# Patient Record
Sex: Female | Born: 1940
Health system: Southern US, Community
[De-identification: ages and names within clinical notes are randomized; demographics above are authoritative.]

## PROBLEM LIST (undated history)

## (undated) DIAGNOSIS — I10 Essential (primary) hypertension: Secondary | ICD-10-CM

## (undated) DIAGNOSIS — F32A Depression, unspecified: Secondary | ICD-10-CM

## (undated) DIAGNOSIS — E7800 Pure hypercholesterolemia, unspecified: Secondary | ICD-10-CM

## (undated) DIAGNOSIS — F329 Major depressive disorder, single episode, unspecified: Secondary | ICD-10-CM

## (undated) DIAGNOSIS — K219 Gastro-esophageal reflux disease without esophagitis: Secondary | ICD-10-CM

## (undated) HISTORY — PX: ABDOMINAL HYSTERECTOMY: SHX81

## (undated) HISTORY — PX: KIDNEY SURGERY: SHX687

## (undated) HISTORY — PX: HIP SURGERY: SHX245

---

## 2018-02-17 ENCOUNTER — Emergency Department (HOSPITAL_BASED_OUTPATIENT_CLINIC_OR_DEPARTMENT_OTHER): Payer: Medicare Other

## 2018-02-17 ENCOUNTER — Other Ambulatory Visit: Payer: Self-pay

## 2018-02-17 ENCOUNTER — Emergency Department (HOSPITAL_BASED_OUTPATIENT_CLINIC_OR_DEPARTMENT_OTHER)
Admission: EM | Admit: 2018-02-17 | Discharge: 2018-02-17 | Disposition: A | Discharge status: Home / Self Care | Payer: Medicare Other | Attending: Emergency Medicine | Admitting: Emergency Medicine

## 2018-02-17 ENCOUNTER — Encounter (HOSPITAL_BASED_OUTPATIENT_CLINIC_OR_DEPARTMENT_OTHER): Payer: Self-pay | Admitting: *Deleted

## 2018-02-17 DIAGNOSIS — Z79899 Other long term (current) drug therapy: Secondary | ICD-10-CM | POA: Insufficient documentation

## 2018-02-17 DIAGNOSIS — I10 Essential (primary) hypertension: Secondary | ICD-10-CM | POA: Diagnosis not present

## 2018-02-17 DIAGNOSIS — S6992XA Unspecified injury of left wrist, hand and finger(s), initial encounter: Secondary | ICD-10-CM | POA: Diagnosis present

## 2018-02-17 DIAGNOSIS — F329 Major depressive disorder, single episode, unspecified: Secondary | ICD-10-CM | POA: Insufficient documentation

## 2018-02-17 DIAGNOSIS — Y9289 Other specified places as the place of occurrence of the external cause: Secondary | ICD-10-CM | POA: Insufficient documentation

## 2018-02-17 DIAGNOSIS — Y998 Other external cause status: Secondary | ICD-10-CM | POA: Insufficient documentation

## 2018-02-17 DIAGNOSIS — S62002A Unspecified fracture of navicular [scaphoid] bone of left wrist, initial encounter for closed fracture: Secondary | ICD-10-CM | POA: Diagnosis not present

## 2018-02-17 DIAGNOSIS — W19XXXA Unspecified fall, initial encounter: Secondary | ICD-10-CM

## 2018-02-17 DIAGNOSIS — Y9389 Activity, other specified: Secondary | ICD-10-CM | POA: Diagnosis not present

## 2018-02-17 DIAGNOSIS — W010XXA Fall on same level from slipping, tripping and stumbling without subsequent striking against object, initial encounter: Secondary | ICD-10-CM | POA: Insufficient documentation

## 2018-02-17 HISTORY — DX: Essential (primary) hypertension: I10

## 2018-02-17 HISTORY — DX: Pure hypercholesterolemia, unspecified: E78.00

## 2018-02-17 HISTORY — DX: Major depressive disorder, single episode, unspecified: F32.9

## 2018-02-17 HISTORY — DX: Depression, unspecified: F32.A

## 2018-02-17 HISTORY — DX: Gastro-esophageal reflux disease without esophagitis: K21.9

## 2018-02-17 MED ORDER — ACETAMINOPHEN 500 MG PO TABS
1000.0000 mg | ORAL_TABLET | Freq: Once | ORAL | Status: AC
  Administered 2018-02-17: 1000 mg via ORAL
  Filled 2018-02-17: qty 2

## 2018-02-17 NOTE — Progress Notes (Signed)
77 yo female with displaced left scaphoid waist fracture. Will be placed into a TS splint in ED with F/u in office to have discussion regarding the options.

## 2018-02-17 NOTE — ED Provider Notes (Signed)
[facility] EMERGENCY DEPARTMENT Provider Note   CSN: [id number] Arrival date & time: 02/17/18  1227     History   Chief Complaint Chief Complaint  Patient presents with  . Fall    HPI Margaret Cross is a 77 y.o. female.  Margaret Cross is a 77 y.o. Female 3 of hypertension, hyperlipidemia, GERD and depression, who presents to the ED for evaluation after mechanical fall complaining of pain to her left wrist.  Patient reports she slipped on wet bathroom floor this morning caught herself with her left hand and since then has had pain and swelling over the radial aspect of the left wrist.  It is constant and throbbing, numbness or tingling.  Patient reports she also went down a bit on her right buttock but denies pain, swelling or ecchymosis there and has been ambulatory without pain or any difficulties.  No back or neck pain.  Patient did not hit her head no loss of consciousness.  Denies any other injuries from the fall.  No lacerations or abrasions.  Patient is right-hand dominant.     Past Medical History:  Diagnosis Date  . Depression   . GERD (gastroesophageal reflux disease)   . High cholesterol   . Hypertension     There are no active problems to display for this patient.   Past Surgical History:  Procedure Laterality Date  . ABDOMINAL HYSTERECTOMY       OB History   None      Home Medications    Prior to Admission medications   Medication Sig Start Date End Date Taking? Authorizing Provider  buPROPion (WELLBUTRIN XL) 150 MG 24 hr tablet Take by mouth. 12/13/17  Yes [provider]  citalopram (CELEXA) 40 MG tablet  02/15/18   [provider]  DEXILANT 60 MG capsule  02/15/18   [provider]  RESTASIS 0.05 % ophthalmic emulsion  02/14/18   [provider]  rosuvastatin (CRESTOR) 5 MG tablet TK 1 T PO ON MWF 12/14/17   [provider]    Family History No family history on file.  Social History Social  History   Tobacco Use  . Smoking status: Never Smoker  . Smokeless tobacco: Never Used  Substance Use Topics  . Alcohol use: Not Currently  . Drug use: Never     Allergies   Sulfa antibiotics   Review of Systems Review of Systems  Constitutional: Negative for chills and fever.  Eyes: Negative for visual disturbance.  Cardiovascular: Negative for chest pain.  Gastrointestinal: Negative for abdominal pain.  Musculoskeletal: Positive for arthralgias and joint swelling. Negative for back pain and neck pain.  Skin: Negative for color change, rash and wound.  Neurological: Negative for dizziness, weakness, light-headedness, numbness and headaches.     Physical Exam Updated Vital Signs BP (!) 168/104   Pulse 85   Temp 98.7 F (37.1 C) (Oral)   Resp 20   Ht 5' 7.5" (1.715 m)   Wt 88.5 kg (195 lb)   SpO2 98%   BMI 30.09 kg/m   Physical Exam  Constitutional: She appears well-developed and well-nourished. No distress.  HENT:  Head: Normocephalic and atraumatic.  No evidence of head trauma, no hematoma, step-off or scalp deformity  Eyes: Right eye exhibits no discharge. Left eye exhibits no discharge.  Neck: Neck supple.  Spine nontender to palpation at midline or paraspinally  Pulmonary/Chest: Effort normal. No respiratory distress.  Musculoskeletal:  Tenderness and swelling over the radial aspect of  the left wrist, tenderness at the snuffbox, range of motion of the thumb limited by pain but patient able to wiggle all fingers, pain with wrist range of motion, 2+ radial and ulnar pulse with good capillary refill, sensation intact throughout the hand, median, ulnar and radial nerve intact, no pain or deformity at the elbow or shoulder No midline spinal tenderness No pain or tenderness at the hips with palpation or range of motion  All other joints supple and easily movable, all compartments soft  Neurological: She is alert. Coordination normal.  Skin: Skin is warm and dry.  Capillary refill takes less than 2 seconds. She is not diaphoretic.  Psychiatric: She has a normal mood and affect. Her behavior is normal.  Nursing note and vitals reviewed.    ED Treatments / Results  Labs (all labs ordered are listed, but only abnormal results are displayed) Labs Reviewed - No data to display  EKG None  Radiology Dg Wrist Complete Left  Result Date: 02/17/2018 CLINICAL DATA:  Slipped and fell today landing on her LEFT hand, pain throughout carpal bones EXAM: LEFT WRIST - COMPLETE 3+ VIEW COMPARISON:  None FINDINGS: Diffuse osseous demineralization. Degenerative changes at first [facility]. Transverse minimally distracted fracture at the scaphoid waist. No additional fracture, dislocation, or bone destruction. IMPRESSION: LEFT scaphoid waist fracture. Electronically Signed   By: Ulyses SouthwardMark  Boles M.D.   On: 02/17/2018 13:07    Procedures Procedures (including critical care time)  Medications Ordered in ED Medications  acetaminophen (TYLENOL) tablet 1,000 mg (1,000 mg Oral Given 02/17/18 1451)     Initial Impression / Assessment and Plan / ED Course  I have reviewed the triage vital signs and the nursing notes.  Pertinent labs & imaging results that were available during my care of the patient were reviewed by me and considered in my medical decision making (see chart for details).  Patient presents for evaluation of left wrist pain after mechanical fall, did not hit her head, no loss of consciousness, no midline spinal tenderness, no chest pain, shortness of breath or abdominal pain.  No hip pain.  On exam patient is intensive did not take her blood pressure medications today, was otherwise normal and patient is in no acute distress.  Swelling and tenderness over the radial aspect of the left wrist, this is the patient's nondominant hand.  No other evidence of injury on exam.  Left upper extremity is neurovascularly intact.  X-ray shows left scaphoid fracture.  Placed  patient in a thumb spica splint, Tylenol for pain.  3:15 PM spoke with Dr. Janee Mornhompson with hand surgery, he will have his office call her early next week to arrange follow-up.  At that she is to remain in a thumb spica splint until she is seen by Dr. Janee Mornhompson in the office.  Pain has significantly improved with splinting and Tylenol, encourage patient to use ice and elevation as much as possible and Tylenol as needed for pain.  Strict return precautions discussed.  Patient expresses understanding and is in agreement with plan.  She is stable for discharge at this time and in no acute distress.  Pt's blood pressure was elevated today, pt has hx of HTN, did not take her medications today, pt is not exhibiting any symptoms to suggest hypertensive urgency or emergency today, will have pt follow up with their PCP in 1 week for blood pressure check. Discussed long term consequences of untreated hypertension with the patient.  Discussed with Dr. Rush Landmarkegeler who is in  agreement with plan.   Final Clinical Impressions(s) / ED Diagnoses   Final diagnoses:  Closed displaced fracture of scaphoid of left wrist, unspecified portion of scaphoid, initial encounter  Fall, initial encounter    ED Discharge Orders    None       Dartha Lodge, [place] 02/17/18 1529    Tegeler, Canary Brim, MD 02/17/18 [phone]

## 2018-02-17 NOTE — Discharge Instructions (Signed)
You will need to follow-up with Dr. Janee Mornhompson in his office, his office will call you early next week to schedule an appointment.  Please remain in your splint until then use ice and elevate the hand as much as possible.  Tylenol as needed for pain.  Return to the ED if you have significantly increasing pain, numbness or tingling in her hand or fingers or discoloration of your fingers or any other new or concerning symptoms develop.

## 2018-02-17 NOTE — ED Triage Notes (Signed)
She slipped and fell this am. Injury to her left wrist. Radial pulse palpated.

## 2020-06-02 ENCOUNTER — Emergency Department (HOSPITAL_BASED_OUTPATIENT_CLINIC_OR_DEPARTMENT_OTHER): Payer: Medicare Other

## 2020-06-02 ENCOUNTER — Emergency Department (HOSPITAL_BASED_OUTPATIENT_CLINIC_OR_DEPARTMENT_OTHER)
Admission: EM | Admit: 2020-06-02 | Discharge: 2020-06-02 | Disposition: A | Discharge status: Home / Self Care | Payer: Medicare Other | Source: Home / Self Care | Attending: Emergency Medicine | Admitting: Emergency Medicine

## 2020-06-02 ENCOUNTER — Encounter (HOSPITAL_BASED_OUTPATIENT_CLINIC_OR_DEPARTMENT_OTHER): Payer: Self-pay | Admitting: Emergency Medicine

## 2020-06-02 ENCOUNTER — Other Ambulatory Visit: Payer: Self-pay

## 2020-06-02 DIAGNOSIS — K810 Acute cholecystitis: Secondary | ICD-10-CM | POA: Diagnosis not present

## 2020-06-02 DIAGNOSIS — R52 Pain, unspecified: Secondary | ICD-10-CM | POA: Diagnosis not present

## 2020-06-02 DIAGNOSIS — Z20822 Contact with and (suspected) exposure to covid-19: Secondary | ICD-10-CM | POA: Insufficient documentation

## 2020-06-02 DIAGNOSIS — R05 Cough: Secondary | ICD-10-CM | POA: Insufficient documentation

## 2020-06-02 DIAGNOSIS — R1010 Upper abdominal pain, unspecified: Secondary | ICD-10-CM

## 2020-06-02 DIAGNOSIS — K219 Gastro-esophageal reflux disease without esophagitis: Secondary | ICD-10-CM

## 2020-06-02 DIAGNOSIS — I10 Essential (primary) hypertension: Secondary | ICD-10-CM | POA: Insufficient documentation

## 2020-06-02 DIAGNOSIS — K449 Diaphragmatic hernia without obstruction or gangrene: Secondary | ICD-10-CM

## 2020-06-02 DIAGNOSIS — K440 Diaphragmatic hernia with obstruction, without gangrene: Secondary | ICD-10-CM | POA: Insufficient documentation

## 2020-06-02 DIAGNOSIS — Z79899 Other long term (current) drug therapy: Secondary | ICD-10-CM | POA: Insufficient documentation

## 2020-06-02 LAB — COMPREHENSIVE METABOLIC PANEL
ALT: 234 U/L — ABNORMAL HIGH (ref 0–44)
AST: 76 U/L — ABNORMAL HIGH (ref 15–41)
Albumin: 3.5 g/dL (ref 3.5–5.0)
Alkaline Phosphatase: 152 U/L — ABNORMAL HIGH (ref 38–126)
Anion gap: 12 (ref 5–15)
BUN: 8 mg/dL (ref 8–23)
CO2: 24 mmol/L (ref 22–32)
Calcium: 8.8 mg/dL — ABNORMAL LOW (ref 8.9–10.3)
Chloride: 101 mmol/L (ref 98–111)
Creatinine, Ser: 0.49 mg/dL (ref 0.44–1.00)
GFR calc Af Amer: >60 mL/min (ref >60)
GFR calc non Af Amer: >60 mL/min (ref >60)
Glucose, Bld: 155 mg/dL — ABNORMAL HIGH (ref 70–99)
Potassium: 3.3 mmol/L — ABNORMAL LOW (ref 3.5–5.1)
Sodium: 137 mmol/L (ref 135–145)
Total Bilirubin: 1.6 mg/dL — ABNORMAL HIGH (ref 0.3–1.2)
Total Protein: 7.5 g/dL (ref 6.5–8.1)

## 2020-06-02 LAB — URINALYSIS, ROUTINE W REFLEX MICROSCOPIC
Bilirubin Urine: NEGATIVE
Glucose, UA: NEGATIVE mg/dL
Hgb urine dipstick: NEGATIVE
Ketones, ur: NEGATIVE mg/dL
Leukocytes,Ua: NEGATIVE
Nitrite: NEGATIVE
Protein, ur: NEGATIVE mg/dL
Specific Gravity, Urine: 1.01 (ref 1.005–1.030)
pH: 6 (ref 5.0–8.0)

## 2020-06-02 LAB — CBC WITH DIFFERENTIAL/PLATELET
Abs Immature Granulocytes: 0.04 10*3/uL (ref 0.00–0.07)
Basophils Absolute: 0.1 10*3/uL (ref 0.0–0.1)
Basophils Relative: 1 %
Eosinophils Absolute: 0.1 10*3/uL (ref 0.0–0.5)
Eosinophils Relative: 1 %
HCT: 44 % (ref 36.0–46.0)
Hemoglobin: 14.9 g/dL (ref 12.0–15.0)
Immature Granulocytes: 1 %
Lymphocytes Relative: 9 %
Lymphs Abs: 0.8 10*3/uL (ref 0.7–4.0)
MCH: 32 pg (ref 26.0–34.0)
MCHC: 33.9 g/dL (ref 30.0–36.0)
MCV: 94.6 fL (ref 80.0–100.0)
Monocytes Absolute: 0.7 10*3/uL (ref 0.1–1.0)
Monocytes Relative: 8 %
Neutro Abs: 7.1 10*3/uL (ref 1.7–7.7)
Neutrophils Relative %: 80 %
Platelets: 150 10*3/uL (ref 150–400)
RBC: 4.65 MIL/uL (ref 3.87–5.11)
RDW: 12.3 % (ref 11.5–15.5)
WBC: 8.7 10*3/uL (ref 4.0–10.5)
nRBC: 0 % (ref 0.0–0.2)

## 2020-06-02 LAB — SARS CORONAVIRUS 2 BY RT PCR (HOSPITAL ORDER, PERFORMED IN ~~LOC~~ HOSPITAL LAB): SARS Coronavirus 2: NEGATIVE

## 2020-06-02 LAB — LACTIC ACID, PLASMA
Lactic Acid, Venous: 2.5 mmol/L (ref 0.5–1.9)
Lactic Acid, Venous: 1.7 mmol/L (ref 0.5–1.9)

## 2020-06-02 LAB — TROPONIN I (HIGH SENSITIVITY): Troponin I (High Sensitivity): 9 ng/L (ref <18)

## 2020-06-02 LAB — LIPASE, BLOOD: Lipase: 24 U/L (ref 11–51)

## 2020-06-02 MED ORDER — SODIUM CHLORIDE 0.9 % IV BOLUS
500.0000 mL | Freq: Once | INTRAVENOUS | Status: AC
  Administered 2020-06-02: 500 mL via INTRAVENOUS

## 2020-06-02 MED ORDER — ALUM & MAG HYDROXIDE-SIMETH 200-200-20 MG/5ML PO SUSP
30.0000 mL | Freq: Once | ORAL | Status: AC
  Administered 2020-06-02: 30 mL via ORAL
  Filled 2020-06-02: qty 30

## 2020-06-02 MED ORDER — ONDANSETRON 4 MG PO TBDP
ORAL_TABLET | ORAL | 0 refills | Status: AC
Start: 2020-06-02 — End: ?

## 2020-06-02 MED ORDER — MORPHINE SULFATE (PF) 4 MG/ML IV SOLN
4.0000 mg | Freq: Once | INTRAVENOUS | Status: AC
  Administered 2020-06-02: 4 mg via INTRAVENOUS
  Filled 2020-06-02: qty 1

## 2020-06-02 MED ORDER — IOHEXOL 300 MG/ML  SOLN
100.0000 mL | Freq: Once | INTRAMUSCULAR | Status: AC | PRN
  Administered 2020-06-02: 100 mL via INTRAVENOUS

## 2020-06-02 MED ORDER — ONDANSETRON HCL 4 MG/2ML IJ SOLN
4.0000 mg | Freq: Once | INTRAMUSCULAR | Status: AC
  Administered 2020-06-02: 4 mg via INTRAVENOUS
  Filled 2020-06-02: qty 2

## 2020-06-02 MED ORDER — LIDOCAINE VISCOUS HCL 2 % MT SOLN
15.0000 mL | Freq: Once | OROMUCOSAL | Status: AC
  Administered 2020-06-02: 15 mL via ORAL
  Filled 2020-06-02: qty 15

## 2020-06-02 MED ORDER — POTASSIUM CHLORIDE CRYS ER 20 MEQ PO TBCR
40.0000 meq | EXTENDED_RELEASE_TABLET | Freq: Once | ORAL | Status: AC
  Administered 2020-06-02: 40 meq via ORAL
  Filled 2020-06-02: qty 2

## 2020-06-02 MED ORDER — FAMOTIDINE IN NACL 20-0.9 MG/50ML-% IV SOLN
20.0000 mg | Freq: Once | INTRAVENOUS | Status: AC
  Administered 2020-06-02: 20 mg via INTRAVENOUS
  Filled 2020-06-02: qty 50

## 2020-06-02 MED FILL — ONDANSETRON ODT 4 MG TABLET: 4 | 2 days supply | Qty: 10 | Fill #0

## 2020-06-02 NOTE — Discharge Instructions (Addendum)
Your work-up today has been very reassuring.  Your CT scan shows a large hiatal hernia which can make GERD worse and lead to some pain and inflammation of the lining of your stomach.  Your gallbladder ultrasound looked good today, but you do have some elevation of your liver function tests, we do see signs of some fatty liver disease.  You should follow-up with your PCP and GI doctor about this.  Continue taking your Dexilant daily, at least 30 minutes before your first meal of the day, you can use Maalox as needed for breakthrough symptoms, and use the eating plan provided in your paperwork today for foods to avoid that would worsen your symptoms.  You can use Zofran as needed for nausea and vomiting.  If you develop fever, worsening upper abdominal pain or any other new or concerning symptoms please return for reevaluation.  Please call to schedule follow-up appointment with your PCP and with Dr. Christella Hartigan with GI.

## 2020-06-02 NOTE — ED Triage Notes (Addendum)
Pt having  N/V since last Wednesday.  Decreased appetite, body aches, some fever, some abdominal pain

## 2020-06-02 NOTE — ED Notes (Signed)
US at bedside

## 2020-06-02 NOTE — ED Provider Notes (Signed)
[facility] [facility] EMERGENCY DEPARTMENT Provider Note   CSN: [id number] Arrival date & time: 06/02/20  6629     History Chief Complaint  Patient presents with  . Abdominal Pain    Margaret Cross is a 79 y.o. female.  Margaret Cross is a 79 y.o. female with a history of hypertension, hyperlipidemia, GERD, depression, who presents to the emergency department for evaluation of upper abdominal pain and nausea and vomiting for the past 6 days.  Patient states that when symptoms first started she had a low-grade temperature of 99, no fever since then.  Reports she started having persistent upper abdominal pain, that is worse after eating.  Pain does not radiate. She reports persistent nausea, with 3-4 episodes of nonbloody emesis.  She states she has not vomited today, but feels extremely nauseated and has had poor appetite.  She states that she was constipated for the past 2 days but was able to have a bowel movement this morning and has been passing a lot of gas.  She denies any dysuria or urinary frequency.  Denies any associated chest pain or shortness of breath.  Has a frequent cough related to her acid reflux which is unchanged.  Has had her Covid vaccines.  Reports history of hysterectomy but no other abdominal surgeries.  Has not been taking any medications to treat her symptoms.  No other aggravating or alleviating factors.        Past Medical History:  Diagnosis Date  . Depression   . GERD (gastroesophageal reflux disease)   . High cholesterol   . Hypertension     There are no problems to display for this patient.   Past Surgical History:  Procedure Laterality Date  . ABDOMINAL HYSTERECTOMY       OB History   No obstetric history on file.     No family history on file.  Social History   Tobacco Use  . Smoking status: Never Smoker  . Smokeless tobacco: Never Used  Substance Use Topics  . Alcohol use: Not Currently  . Drug use: Never    Home  Medications Prior to Admission medications   Medication Sig Start Date End Date Taking? Authorizing Provider  buPROPion (WELLBUTRIN XL) 150 MG 24 hr tablet Take by mouth. 12/13/17   [provider]  citalopram (CELEXA) 40 MG tablet  02/15/18   [provider]  DEXILANT 60 MG capsule  02/15/18   [provider]  ondansetron (ZOFRAN ODT) 4 MG disintegrating tablet 26m ODT q4 hours prn nausea/vomit 06/02/20   FJacqlyn Larsen PA-C  RESTASIS 0.05 % ophthalmic emulsion  02/14/18   [provider]  rosuvastatin (CRESTOR) 5 MG tablet TK 1 T PO ON MWF 12/14/17   [provider]    Allergies    Bupropion and Sulfa antibiotics  Review of Systems   Review of Systems  Constitutional: Positive for appetite change and chills. Negative for fever.  HENT: Negative.   Respiratory: Positive for cough (chronic, unchanged). Negative for shortness of breath.   Cardiovascular: Negative for chest pain.  Gastrointestinal: Positive for abdominal pain, nausea and vomiting. Negative for blood in stool, constipation and diarrhea.  Genitourinary: Negative for dysuria, flank pain, frequency and hematuria.  Musculoskeletal: Negative for arthralgias, back pain and myalgias.  Skin: Negative for color change and rash.  Neurological: Negative for dizziness, syncope and light-headedness.  All other systems reviewed and are negative.   Physical Exam Updated Vital Signs BP (!) 160/89   Pulse 97  Temp 98.6 F (37 C) (Oral)   Resp 20   Ht 5' 7"  (1.702 m)   Wt 87.4 kg   SpO2 95%   BMI 30.18 kg/m   Physical Exam Vitals and nursing note reviewed.  Constitutional:      General: She is not in acute distress.    Appearance: Normal appearance. She is well-developed and normal weight. She is not diaphoretic.     Comments: Elderly female, alert, well-appearing and in no acute distress  HENT:     Head: Normocephalic and atraumatic.  Eyes:     General:        Right eye: No  discharge.        Left eye: No discharge.     Pupils: Pupils are equal, round, and reactive to light.  Cardiovascular:     Rate and Rhythm: Normal rate and regular rhythm.     Heart sounds: Normal heart sounds.  Pulmonary:     Effort: Pulmonary effort is normal. No respiratory distress.     Breath sounds: Normal breath sounds. No wheezing or rales.  Abdominal:     General: Bowel sounds are normal. There is no distension.     Palpations: Abdomen is soft. There is no mass.     Tenderness: There is abdominal tenderness in the right upper quadrant, epigastric area and left upper quadrant. There is no guarding.     Comments: Abdomen is soft, nondistended, bowel sounds present throughout, there is tenderness across the upper abdomen, most notably in the right upper quadrant, no guarding or peritoneal signs  Musculoskeletal:        General: No deformity.     Cervical back: Neck supple.  Skin:    General: Skin is warm and dry.     Capillary Refill: Capillary refill takes less than 2 seconds.  Neurological:     General: No focal deficit present.     Mental Status: She is alert.     Coordination: Coordination normal.     Comments: Speech is clear, able to follow commands Moves extremities without ataxia, coordination intact  Psychiatric:        Mood and Affect: Mood normal.        Behavior: Behavior normal.     ED Results / Procedures / Treatments   Labs (all labs ordered are listed, but only abnormal results are displayed) Labs Reviewed  LACTIC ACID, PLASMA - Abnormal; Notable for the following components:      Result Value   Lactic Acid, Venous 2.5 (*)    All other components within normal limits  COMPREHENSIVE METABOLIC PANEL - Abnormal; Notable for the following components:   Potassium 3.3 (*)    Glucose, Bld 155 (*)    Calcium 8.8 (*)    AST 76 (*)    ALT 234 (*)    Alkaline Phosphatase 152 (*)    Total Bilirubin 1.6 (*)    All other components within normal limits  SARS  CORONAVIRUS 2 BY RT PCR (HOSPITAL ORDER, [facility])  CBC WITH DIFFERENTIAL/PLATELET  URINALYSIS, ROUTINE W REFLEX MICROSCOPIC  LACTIC ACID, PLASMA  LIPASE, BLOOD  TROPONIN I (HIGH SENSITIVITY)    EKG EKG Interpretation  Date/Time:  Monday June 02 2020 10:09:52 EDT Ventricular Rate:  84 PR Interval:    QRS Duration: 99 QT Interval:  415 QTC Calculation: 491 R Axis:   18 Text Interpretation: Sinus rhythm Borderline prolonged QT interval No significant change since last tracing Confirmed by  Gareth Morgan [phone]) on 06/02/2020 1:53:29 PM   Radiology DG Chest 2 View  Result Date: 06/02/2020 CLINICAL DATA:  Nausea and vomiting EXAM: CHEST - 2 VIEW COMPARISON:  Same day CT FINDINGS: The cardiomediastinal silhouette is normal in contour with a tortuous thoracic aorta.Atherosclerotic calcifications of the aorta. No pleural effusion. No pneumothorax. RIGHT basilar heterogeneous opacity. Hiatal hernia. Visualized abdomen is unremarkable. Multilevel degenerative changes of the thoracic spine. IMPRESSION: RIGHT basilar heterogeneous opacity. Differential considerations include atelectasis, aspiration or infection. Electronically Signed   By: Valentino Saxon MD   On: 06/02/2020 14:12   CT ABDOMEN PELVIS W CONTRAST  Result Date: 06/02/2020 CLINICAL DATA:  Acute upper abdominal pain. EXAM: CT ABDOMEN AND PELVIS WITH CONTRAST TECHNIQUE: Multidetector CT imaging of the abdomen and pelvis was performed using the standard protocol following bolus administration of intravenous contrast. CONTRAST:  128m OMNIPAQUE IOHEXOL 300 MG/ML  SOLN COMPARISON:  Ultrasound of same day. FINDINGS: Lower chest: Moderate size sliding-type hiatal hernia is noted. Visualized lung bases are unremarkable. Hepatobiliary: No focal liver abnormality is seen. No gallstones or biliary dilatation. Mild inflammatory changes appear to be present around the gallbladder. Pancreas: Unremarkable. No pancreatic  ductal dilatation or surrounding inflammatory changes. Spleen: Calcified splenic granulomata are noted. Possible 9 mm calcified distal splenic artery aneurysm. Adrenals/Urinary Tract: Adrenal glands are unremarkable. Kidneys are normal, without renal calculi, focal lesion, or hydronephrosis. Bladder is unremarkable. Stomach/Bowel: There is no evidence of bowel obstruction or inflammation. The appendix is unremarkable. Moderate size sliding-type hiatal hernia is again noted. Vascular/Lymphatic: Aortic atherosclerosis. No enlarged abdominal or pelvic lymph nodes. Reproductive: Status post hysterectomy. No adnexal masses. Other: No abdominal wall hernia or abnormality. No abdominopelvic ascites. Musculoskeletal: No acute or significant osseous findings. IMPRESSION: 1. Moderate size sliding-type hiatal hernia. 2. Mild inflammatory changes appear to be present around the gallbladder. No gallstones or biliary dilatation is noted. HIDA scan may be performed for further evaluation. 3. Possible 9 mm calcified distal splenic artery aneurysm. Aortic Atherosclerosis (ICD10-I70.0). Electronically Signed   By: JMarijo ConceptionM.D.   On: 06/02/2020 13:29   [place]dom[place] Limited RUQ  Result Date: 06/02/2020 CLINICAL DATA:  79year old female with a history of upper abdominal pain EXAM: ULTRASOUND ABDOMEN LIMITED RIGHT UPPER QUADRANT COMPARISON:  None. FINDINGS: Gallbladder: No gallstones or wall thickening visualized. No sonographic Murphy sign noted by sonographer. Common bile duct: Diameter: 4 mm Liver: Heterogeneous echogenicity of the liver, relatively increased. Focal fatty sparing adjacent to the gallbladder. No focal lesion. Portal vein is patent on color Doppler imaging with normal direction of blood flow towards the liver. Other: None. IMPRESSION: Sonographic survey negative for cholelithiasis or sonographic evidence of acute cholecystitis. Liver steatosis. Electronically Signed   By: JCorrie MckusickD.O.   On: 06/02/2020  10:46    Procedures Procedures (including critical care time)  Medications Ordered in ED Medications  sodium chloride 0.9 % bolus 500 mL (0 mLs Intravenous Stopped 06/02/20 1202)  ondansetron (ZOFRAN) injection 4 mg (4 mg Intravenous Given 06/02/20 1004)  morphine 4 MG/ML injection 4 mg (4 mg Intravenous Given 06/02/20 1007)  famotidine (PEPCID) IVPB 20 mg premix (0 mg Intravenous Stopped 06/02/20 1202)  sodium chloride 0.9 % bolus 500 mL ( Intravenous Stopped 06/02/20 1432)  iohexol (OMNIPAQUE) 300 MG/ML solution 100 mL (100 mLs Intravenous Contrast Given 06/02/20 1307)  potassium chloride SA (KLOR-CON) CR tablet 40 mEq (40 mEq Oral Given 06/02/20 1309)  alum & mag hydroxide-simeth (MAALOX/MYLANTA) 200-200-20 MG/5ML suspension 30 mL (30 mLs Oral  Given 06/02/20 1437)    And  lidocaine (XYLOCAINE) 2 % viscous mouth solution 15 mL (15 mLs Oral Given 06/02/20 1437)    ED Course  I have reviewed the triage vital signs and the nursing notes.  Pertinent labs & imaging results that were available during my care of the patient were reviewed by me and considered in my medical decision making (see chart for details).    MDM Rules/Calculators/A&P                         79 year old female presents with upper abdominal pain, nausea and vomiting for the past 6 days.  Associated with decreased appetite, and body aches.  No documented fevers.  No associated chest pain, shortness of breath or cough.  Patient has had her Covid vaccines.  She reports persistent pain across the upper abdomen is worse after eating, poor appetite and persistent nausea, no blood in emesis or stool.  Patient is overall well-appearing, hypertensive but all other vitals normal.  She does not have any peritoneal signs on exam.  Will get abdominal labs including lactic acid given postprandial plane, some concern for mesenteric ischemia, although pain does not seem out of proportion for exam.  Also concern for potential gallbladder pathology, she  also has a history of GERD.  Will get right upper quadrant ultrasound but if negative patient will need CT.  I have independently ordered, reviewed and interpreted all labs and imaging: CBC: No leukocytosis, normal hemoglobin CMP: Mild hypokalemia of 3.3, glucose of 155, no other significant electrolyte derangements, mild elevation of AST and ALT, alk phos of 152 and T bili of 1.6. Lipase: WNL Lactic acid: Initially 2.5, I suspect this is in the setting of dehydration, given 1 week of pain, would expect to be higher in the setting of mesenteric ischemia, after fluids lactic improved to 1.7 UA: No signs of infection Covid: Negative Troponin: Negative  RUQ [place]: No evidence of acute cholecystitis some fatty liver disease noted  CT abdomen pelvis: Moderate sliding hiatal hernia, some possible inflammation noted around gallbladder, no evidence of biliary dilatation or obstruction on CT, inflammation not noted on ultrasound which is more sensitive view of the gallbladder.  Calcified aneurysm of splenic artery noted.  Medications: Patient given IV fluids, antiemetics, pain medication, Pepcid for potential GERD.  Also given p.o. potassium replacement.  After initial medications patient symptoms are improving, she was given a GI cocktail with continued improvement and has been tolerating p.o. fluids since then.  Patient's troponin is negative.  CT with question of possible right basilar opacity, potential atelectasis versus infection, but bases of the lungs on CT appear clear, patient has no leukocytosis or cough and is afebrile and I doubt infection.  We will have patient continue with PPI, encouraged her to use Maalox as needed for breakthrough symptoms, Zofran prescribed for nausea and vomiting, encouraged to have small frequent meals hand plenty of fluids and follow closely with PCP and GI.  Patient and daughter expressed understanding and agreement with plan.  Discharged home in good  condition.  Patient discussed with Dr. Billy Fischer, who saw patient as well and agrees with plan, recommends checking troponin and chest x-ray.  Final Clinical Impression(s) / ED Diagnoses Final diagnoses:  Upper abdominal pain  Hiatal hernia with GERD    Rx / DC Orders ED Discharge Orders         Ordered    ondansetron (ZOFRAN ODT) 4 MG disintegrating tablet  Discontinue  Reprint     06/02/20 1523           Jacqlyn Larsen, PA-C 06/02/20 1623    Gareth Morgan, MD 06/03/20 2256

## 2020-06-05 ENCOUNTER — Emergency Department (HOSPITAL_BASED_OUTPATIENT_CLINIC_OR_DEPARTMENT_OTHER): Payer: Medicare Other

## 2020-06-05 ENCOUNTER — Inpatient Hospital Stay (HOSPITAL_BASED_OUTPATIENT_CLINIC_OR_DEPARTMENT_OTHER)
Admission: EM | Admit: 2020-06-05 | Discharge: 2020-06-12 | DRG: 417 | Disposition: A | Discharge status: Home / Self Care | Payer: Medicare Other | Attending: Internal Medicine | Admitting: Internal Medicine

## 2020-06-05 ENCOUNTER — Encounter (HOSPITAL_BASED_OUTPATIENT_CLINIC_OR_DEPARTMENT_OTHER): Payer: Self-pay | Admitting: Emergency Medicine

## 2020-06-05 ENCOUNTER — Other Ambulatory Visit: Payer: Self-pay

## 2020-06-05 DIAGNOSIS — K219 Gastro-esophageal reflux disease without esophagitis: Secondary | ICD-10-CM | POA: Diagnosis present

## 2020-06-05 DIAGNOSIS — I10 Essential (primary) hypertension: Secondary | ICD-10-CM | POA: Diagnosis present

## 2020-06-05 DIAGNOSIS — Z791 Long term (current) use of non-steroidal anti-inflammatories (NSAID): Secondary | ICD-10-CM

## 2020-06-05 DIAGNOSIS — R7401 Elevation of levels of liver transaminase levels: Secondary | ICD-10-CM | POA: Diagnosis present

## 2020-06-05 DIAGNOSIS — F329 Major depressive disorder, single episode, unspecified: Secondary | ICD-10-CM | POA: Diagnosis present

## 2020-06-05 DIAGNOSIS — R52 Pain, unspecified: Secondary | ICD-10-CM

## 2020-06-05 DIAGNOSIS — J9601 Acute respiratory failure with hypoxia: Secondary | ICD-10-CM | POA: Diagnosis present

## 2020-06-05 DIAGNOSIS — F419 Anxiety disorder, unspecified: Secondary | ICD-10-CM | POA: Diagnosis present

## 2020-06-05 DIAGNOSIS — K5900 Constipation, unspecified: Secondary | ICD-10-CM | POA: Diagnosis not present

## 2020-06-05 DIAGNOSIS — E860 Dehydration: Secondary | ICD-10-CM | POA: Diagnosis present

## 2020-06-05 DIAGNOSIS — R739 Hyperglycemia, unspecified: Secondary | ICD-10-CM | POA: Diagnosis present

## 2020-06-05 DIAGNOSIS — Z79899 Other long term (current) drug therapy: Secondary | ICD-10-CM

## 2020-06-05 DIAGNOSIS — R0902 Hypoxemia: Secondary | ICD-10-CM

## 2020-06-05 DIAGNOSIS — G4700 Insomnia, unspecified: Secondary | ICD-10-CM | POA: Diagnosis present

## 2020-06-05 DIAGNOSIS — K449 Diaphragmatic hernia without obstruction or gangrene: Secondary | ICD-10-CM | POA: Diagnosis present

## 2020-06-05 DIAGNOSIS — K567 Ileus, unspecified: Secondary | ICD-10-CM

## 2020-06-05 DIAGNOSIS — J189 Pneumonia, unspecified organism: Secondary | ICD-10-CM | POA: Insufficient documentation

## 2020-06-05 DIAGNOSIS — E7800 Pure hypercholesterolemia, unspecified: Secondary | ICD-10-CM | POA: Diagnosis present

## 2020-06-05 DIAGNOSIS — R140 Abdominal distension (gaseous): Secondary | ICD-10-CM

## 2020-06-05 DIAGNOSIS — J9811 Atelectasis: Secondary | ICD-10-CM

## 2020-06-05 DIAGNOSIS — Z90710 Acquired absence of both cervix and uterus: Secondary | ICD-10-CM | POA: Diagnosis not present

## 2020-06-05 DIAGNOSIS — E871 Hypo-osmolality and hyponatremia: Secondary | ICD-10-CM | POA: Diagnosis present

## 2020-06-05 DIAGNOSIS — R1011 Right upper quadrant pain: Secondary | ICD-10-CM | POA: Diagnosis present

## 2020-06-05 DIAGNOSIS — Z888 Allergy status to other drugs, medicaments and biological substances status: Secondary | ICD-10-CM | POA: Diagnosis not present

## 2020-06-05 DIAGNOSIS — Z20822 Contact with and (suspected) exposure to covid-19: Secondary | ICD-10-CM | POA: Diagnosis present

## 2020-06-05 DIAGNOSIS — K810 Acute cholecystitis: Principal | ICD-10-CM | POA: Diagnosis present

## 2020-06-05 DIAGNOSIS — Z882 Allergy status to sulfonamides status: Secondary | ICD-10-CM

## 2020-06-05 DIAGNOSIS — E785 Hyperlipidemia, unspecified: Secondary | ICD-10-CM | POA: Diagnosis present

## 2020-06-05 DIAGNOSIS — K8020 Calculus of gallbladder without cholecystitis without obstruction: Secondary | ICD-10-CM

## 2020-06-05 DIAGNOSIS — K5904 Chronic idiopathic constipation: Secondary | ICD-10-CM | POA: Diagnosis not present

## 2020-06-05 DIAGNOSIS — Z8249 Family history of ischemic heart disease and other diseases of the circulatory system: Secondary | ICD-10-CM | POA: Diagnosis not present

## 2020-06-05 DIAGNOSIS — R109 Unspecified abdominal pain: Secondary | ICD-10-CM

## 2020-06-05 DIAGNOSIS — E876 Hypokalemia: Secondary | ICD-10-CM | POA: Clinically undetermined

## 2020-06-05 LAB — COMPREHENSIVE METABOLIC PANEL
ALT: 75 U/L — ABNORMAL HIGH (ref 0–44)
AST: 21 U/L (ref 15–41)
Albumin: 3.4 g/dL — ABNORMAL LOW (ref 3.5–5.0)
Alkaline Phosphatase: 103 U/L (ref 38–126)
Anion gap: 12 (ref 5–15)
BUN: 21 mg/dL (ref 8–23)
CO2: 23 mmol/L (ref 22–32)
Calcium: 9.6 mg/dL (ref 8.9–10.3)
Chloride: 97 mmol/L — ABNORMAL LOW (ref 98–111)
Creatinine, Ser: 1.13 mg/dL — ABNORMAL HIGH (ref 0.44–1.00)
GFR calc Af Amer: 53 mL/min — ABNORMAL LOW (ref >60)
GFR calc non Af Amer: 46 mL/min — ABNORMAL LOW (ref >60)
Glucose, Bld: 200 mg/dL — ABNORMAL HIGH (ref 70–99)
Potassium: 3.7 mmol/L (ref 3.5–5.1)
Sodium: 132 mmol/L — ABNORMAL LOW (ref 135–145)
Total Bilirubin: 1.2 mg/dL (ref 0.3–1.2)
Total Protein: 6.9 g/dL (ref 6.5–8.1)

## 2020-06-05 LAB — CBC WITH DIFFERENTIAL/PLATELET
Abs Immature Granulocytes: 0.34 10*3/uL — ABNORMAL HIGH (ref 0.00–0.07)
Basophils Absolute: 0.1 10*3/uL (ref 0.0–0.1)
Basophils Relative: 0 %
Eosinophils Absolute: 0 10*3/uL (ref 0.0–0.5)
Eosinophils Relative: 0 %
HCT: 40.2 % (ref 36.0–46.0)
Hemoglobin: 13.5 g/dL (ref 12.0–15.0)
Immature Granulocytes: 2 %
Lymphocytes Relative: 4 %
Lymphs Abs: 0.9 10*3/uL (ref 0.7–4.0)
MCH: 32.2 pg (ref 26.0–34.0)
MCHC: 33.6 g/dL (ref 30.0–36.0)
MCV: 95.9 fL (ref 80.0–100.0)
Monocytes Absolute: 0.8 10*3/uL (ref 0.1–1.0)
Monocytes Relative: 4 %
Neutro Abs: 18.9 10*3/uL — ABNORMAL HIGH (ref 1.7–7.7)
Neutrophils Relative %: 90 %
Platelets: 206 10*3/uL (ref 150–400)
RBC: 4.19 MIL/uL (ref 3.87–5.11)
RDW: 13 % (ref 11.5–15.5)
WBC: 21 10*3/uL — ABNORMAL HIGH (ref 4.0–10.5)
nRBC: 0 % (ref 0.0–0.2)

## 2020-06-05 LAB — URINALYSIS, MICROSCOPIC (REFLEX)

## 2020-06-05 LAB — URINALYSIS, ROUTINE W REFLEX MICROSCOPIC
Glucose, UA: NEGATIVE mg/dL
Hgb urine dipstick: NEGATIVE
Ketones, ur: NEGATIVE mg/dL
Leukocytes,Ua: NEGATIVE
Nitrite: NEGATIVE
Protein, ur: 30 mg/dL — AB
Specific Gravity, Urine: <1.005 — ABNORMAL LOW (ref 1.005–1.030)
pH: 6.5 (ref 5.0–8.0)

## 2020-06-05 LAB — LACTIC ACID, PLASMA: Lactic Acid, Venous: 1.1 mmol/L (ref 0.5–1.9)

## 2020-06-05 LAB — LIPASE, BLOOD: Lipase: 26 U/L (ref 11–51)

## 2020-06-05 LAB — SARS CORONAVIRUS 2 BY RT PCR (HOSPITAL ORDER, PERFORMED IN ~~LOC~~ HOSPITAL LAB): SARS Coronavirus 2: NEGATIVE

## 2020-06-05 MED ORDER — SODIUM CHLORIDE 0.9 % IV SOLN: INTRAVENOUS | Status: DC | PRN

## 2020-06-05 MED ORDER — SODIUM CHLORIDE 0.9 % IV SOLN
2.0000 g | INTRAVENOUS | Status: AC
  Administered 2020-06-06 – 2020-06-10 (×4): 2 g via INTRAVENOUS
  Filled 2020-06-05 (×3): qty 20
  Filled 2020-06-05 (×2): qty 2
  Filled 2020-06-05: qty 20

## 2020-06-05 MED ORDER — ACETAMINOPHEN 650 MG RE SUPP: 650.0000 mg | Freq: Four times a day (QID) | RECTAL | Status: DC | PRN

## 2020-06-05 MED ORDER — IOHEXOL 350 MG/ML SOLN
100.0000 mL | Freq: Once | INTRAVENOUS | Status: AC | PRN
  Administered 2020-06-05: 100 mL via INTRAVENOUS

## 2020-06-05 MED ORDER — SODIUM CHLORIDE 0.9 % IV SOLN
1.0000 g | Freq: Once | INTRAVENOUS | Status: AC
  Administered 2020-06-05: 1 g via INTRAVENOUS
  Filled 2020-06-05: qty 10

## 2020-06-05 MED ORDER — PANTOPRAZOLE SODIUM 40 MG PO TBEC
40.0000 mg | DELAYED_RELEASE_TABLET | Freq: Every day | ORAL | Status: DC
  Administered 2020-06-05: 40 mg via ORAL
  Filled 2020-06-05: qty 1

## 2020-06-05 MED ORDER — ONDANSETRON HCL 4 MG PO TABS
4.0000 mg | ORAL_TABLET | Freq: Four times a day (QID) | ORAL | Status: DC | PRN
  Administered 2020-06-07 (×2): 4 mg via ORAL
  Filled 2020-06-05: qty 1

## 2020-06-05 MED ORDER — ACETAMINOPHEN 325 MG PO TABS
650.0000 mg | ORAL_TABLET | Freq: Four times a day (QID) | ORAL | Status: DC | PRN
  Administered 2020-06-07: 650 mg via ORAL
  Filled 2020-06-05 (×2): qty 2

## 2020-06-05 MED ORDER — SODIUM CHLORIDE 0.9 % IV SOLN: INTRAVENOUS | Status: DC

## 2020-06-05 MED ORDER — SODIUM CHLORIDE 0.9 % IV SOLN
500.0000 mg | Freq: Once | INTRAVENOUS | Status: AC
  Administered 2020-06-05: 500 mg via INTRAVENOUS
  Filled 2020-06-05: qty 500

## 2020-06-05 MED ORDER — ENOXAPARIN SODIUM 40 MG/0.4ML ~~LOC~~ SOLN
40.0000 mg | SUBCUTANEOUS | Status: DC
  Administered 2020-06-05: 40 mg via SUBCUTANEOUS
  Filled 2020-06-05 (×2): qty 0.4

## 2020-06-05 MED ORDER — OXYCODONE HCL 5 MG PO TABS
5.0000 mg | ORAL_TABLET | ORAL | Status: DC | PRN
  Administered 2020-06-05 – 2020-06-07 (×2): 5 mg via ORAL
  Filled 2020-06-05 (×2): qty 1

## 2020-06-05 MED ORDER — METRONIDAZOLE IN NACL 5-0.79 MG/ML-% IV SOLN
500.0000 mg | Freq: Three times a day (TID) | INTRAVENOUS | Status: DC
  Administered 2020-06-05 – 2020-06-08 (×8): 500 mg via INTRAVENOUS
  Filled 2020-06-05 (×8): qty 100

## 2020-06-05 MED ORDER — FENTANYL CITRATE (PF) 100 MCG/2ML IJ SOLN
50.0000 ug | Freq: Once | INTRAMUSCULAR | Status: AC
  Administered 2020-06-05: 50 ug via INTRAVENOUS
  Filled 2020-06-05: qty 2

## 2020-06-05 MED ORDER — SODIUM CHLORIDE 0.9 % IV BOLUS
1000.0000 mL | Freq: Once | INTRAVENOUS | Status: AC
  Administered 2020-06-05: 1000 mL via INTRAVENOUS

## 2020-06-05 MED ORDER — AZITHROMYCIN 250 MG PO TABS: 500.0000 mg | ORAL_TABLET | Freq: Every day | ORAL | Status: DC

## 2020-06-05 MED ORDER — CITALOPRAM HYDROBROMIDE 20 MG PO TABS
40.0000 mg | ORAL_TABLET | Freq: Every day | ORAL | Status: DC
  Administered 2020-06-06 (×2): 40 mg via ORAL
  Filled 2020-06-05 (×2): qty 2

## 2020-06-05 MED ORDER — ONDANSETRON HCL 4 MG/2ML IJ SOLN
4.0000 mg | Freq: Four times a day (QID) | INTRAMUSCULAR | Status: DC | PRN
  Filled 2020-06-05: qty 2

## 2020-06-05 NOTE — Plan of Care (Signed)

## 2020-06-05 NOTE — H&P (Signed)
[facility] History and Physical  Alorah Mcree YKD:[id number] DOB: [birth year] DOA: 06/05/2020   PCP: Harold Barban, MD  Specialists: None  Chief Complaint: Right abdominal pain, shortness of breath  HPI: Margaret Cross is a 79 y.o. female with a past medical history of anxiety disorder, essential hypertension who was in her usual state of health till about 2 weeks ago when she noticed that she had lost her appetite.  She felt fatigued.   She also had bouts of nausea and vomiting about a week ago. And then about 3 days prior to this admission patient started developing abdominal pain in the right upper quadrant along with some nausea.    She went to the emergency department and was evaluated and was noted to have abnormal LFTs.  A CT abdomen raised concern for inflammation in the gallbladder.  Abdominal ultrasound however did not show any acute cholecystitis.  Patient was discharged home.  She came back to the emergency department today due to persistent symptoms.  She has also developed some shortness of breath. She has chronic acid reflux and has a chronic cough as a result of the same. Her cough really has not changed much.  Evaluation done today showed improvement in the LFTs.  A CT angiogram was done which did not show any PE but raised concern for possible infectious process in the lung.  It was felt that perhaps this was causing her abdominal symptoms.  Patient currently denies any chest pain.  She continues to have pain in the right upper abdomen which she rates at 6 out of 10 in intensity without any radiation.  No precipitating aggravating or relieving factors.  She did have fever last week as well.  She has noticed that she tends to get a little short of breath when she talks a lot.  Denies any diarrhea.  See above for ED course.  Home Medications: Prior to Admission medications   Medication Sig Start Date End Date Taking? Authorizing Provider  amitriptyline (ELAVIL) 25 MG  tablet Take 25 mg by mouth at bedtime. 05/26/20   [provider]  amLODipine (NORVASC) 10 MG tablet Take 10 mg by mouth daily. 03/13/20   [provider]  citalopram (CELEXA) 40 MG tablet  02/15/18   [provider]  DEXILANT 60 MG capsule  02/15/18   [provider]  ondansetron (ZOFRAN ODT) 4 MG disintegrating tablet 4mg  ODT q4 hours prn nausea/vomit 06/02/20   08/02/20, PA-C  RESTASIS 0.05 % ophthalmic emulsion  02/14/18   [provider]  rosuvastatin (CRESTOR) 5 MG tablet TK 1 T PO ON MWF 12/14/17   [provider]    Allergies:  Allergies  Allergen Reactions  . Bupropion Other (See Comments)    Headache and hand tremors  . Sulfa Antibiotics Nausea And Vomiting    rash Other reaction(s): Other (See Comments) unknown unknown     Past Medical History: Past Medical History:  Diagnosis Date  . Depression   . GERD (gastroesophageal reflux disease)   . High cholesterol   . Hypertension     Past Surgical History:  Procedure Laterality Date  . ABDOMINAL HYSTERECTOMY    . KIDNEY SURGERY      Social History: Lives in [place].  Her daughter is living with her.  No history of smoking alcohol use or illicit drug use.  Usually independent with daily activities.  Family History:  Family History  Problem Relation Age of Onset  . Heart disease Father  Review of Systems - History obtained from the patient General ROS: positive for  - fatigue Psychological ROS: positive for - anxiety Ophthalmic ROS: negative ENT ROS: negative Allergy and Immunology ROS: negative Hematological and Lymphatic ROS: negative Endocrine ROS: negative Respiratory ROS: As in HPI Cardiovascular ROS: As in HPI Gastrointestinal ROS: As in HPI Genito-Urinary ROS: no dysuria, trouble voiding, or hematuria Musculoskeletal ROS: negative Neurological ROS: no TIA or stroke symptoms Dermatological ROS: negative  Physical Examination  Vitals:    06/05/20 1400 06/05/20 1500 06/05/20 1530 06/05/20 1623  BP: (!) 122/58 127/64 (!) 147/77 137/72  Pulse: 81 81 92 87  Resp: (!) 21 19 (!) 22 15  Temp:    98.3 F (36.8 C)  TempSrc:    Oral  SpO2: 93% 90% (!) 89% 90%  Weight:      Height:        BP 137/72 (BP Location: Right Arm)   Pulse 87   Temp 98.3 F (36.8 C) (Oral)   Resp 15   Ht 5\' 7"  (1.702 m)   Wt 90.2 kg   SpO2 90%   BMI 31.15 kg/m   General appearance: alert, cooperative, appears stated age and no distress Head: Normocephalic, without obvious abnormality, atraumatic Eyes: conjunctivae/corneas clear. PERRL, EOM's intact.  Throat: lips, mucosa, and tongue normal; teeth and gums normal Neck: no adenopathy, no carotid bruit, no JVD, supple, symmetrical, trachea midline and thyroid not enlarged, symmetric, no tenderness/mass/nodules Back: symmetric, no curvature. ROM normal. No CVA tenderness. Resp: Normal effort at rest.  Few crackles heard bilateral bases.  No wheezing or rhonchi. Cardio: regular rate and rhythm, S1, S2 normal, no murmur, click, rub or gallop GI: Abdomen soft.  Tenderness is appreciated in the right upper quadrant with equivocal Murphy sign.. Mild tenderness in the epigastric area as well.  No rebound rigidity or guarding.  No masses organomegaly.  Bowel sounds sluggish but present. Extremities: extremities normal, atraumatic, no cyanosis or edema Pulses: 2+ and symmetric Skin: Skin color, texture, turgor normal. No rashes or lesions Lymph nodes: Cervical, supraclavicular, and axillary nodes normal. Neurologic: Alert and oriented x3.  No obvious focal neurological deficits.    Labs on Admission: I have personally reviewed following labs and imaging studies  CBC: Recent Labs  Lab 06/02/20 0939 06/05/20 0901  WBC 8.7 21.0*  NEUTROABS 7.1 18.9*  HGB 14.9 13.5  HCT 44.0 40.2  MCV 94.6 95.9  PLT 150 206   Basic Metabolic Panel: Recent Labs  Lab 06/02/20 1159 06/05/20 0901  NA 137 132*  K  3.3* 3.7  CL 101 97*  CO2 24 23  GLUCOSE 155* 200*  BUN 8 21  CREATININE 0.49 1.13*  CALCIUM 8.8* 9.6   GFR: Estimated Creatinine Clearance: 45.8 mL/min (A) (by C-G formula based on SCr of 1.13 mg/dL (H)). Liver Function Tests: Recent Labs  Lab 06/02/20 1159 06/05/20 0901  AST 76* 21  ALT 234* 75*  ALKPHOS 152* 103  BILITOT 1.6* 1.2  PROT 7.5 6.9  ALBUMIN 3.5 3.4*   Recent Labs  Lab 06/02/20 1159 06/05/20 0901  LIPASE 24 26     Radiological Exams on Admission: CT Angio Chest PE W and/or Wo Contrast  Result Date: 06/05/2020 CLINICAL DATA:  Right upper quadrant pain EXAM: CT ANGIOGRAPHY CHEST WITH CONTRAST TECHNIQUE: Multidetector CT imaging of the chest was performed using the standard protocol during bolus administration of intravenous contrast. Multiplanar CT image reconstructions and MIPs were obtained to evaluate the vascular anatomy. CONTRAST:  100mL  OMNIPAQUE IOHEXOL 350 MG/ML SOLN COMPARISON:  None. FINDINGS: Cardiovascular: No filling defects in the pulmonary arteries to suggest pulmonary emboli. Aortic atherosclerosis and coronary artery disease. Heart is normal size. Aorta normal caliber. Mediastinum/Nodes: No mediastinal, hilar, or axillary adenopathy. Moderate-sized hiatal hernia. Thyroid unremarkable. Lungs/Pleura: Bilateral lower lobe airspace opacities, right greater than left could reflect atelectasis or infiltrate/pneumonia. Trace right pleural effusion. Upper Abdomen: Calcifications in the spleen compatible with old granulomatous disease. Several small calcified splenic artery aneurysms measuring up to 12 mm. No acute findings. Musculoskeletal: Chest wall soft tissues are unremarkable. Mild compression fracture through the superior endplate of a lower thoracic vertebral body, likely T10. Review of the MIP images confirms the above findings. IMPRESSION: No evidence of pulmonary embolus. Bilateral lower lobe airspace opacities, right greater than left which could  reflect atelectasis or pneumonia. Trace right pleural effusion. Coronary artery disease. 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 Moderate-sized hiatal hernia. Mild compression fracture through the superior endplate at T10, age indeterminate. Aortic Atherosclerosis (ICD10-I70.0). Electronically Signed   By: Charlett Nose M.D.   On: 06/05/2020 10:34   DG Chest Port 1 View  Result Date: 06/05/2020 CLINICAL DATA:  RIGHT upper quadrant pain EXAM: PORTABLE CHEST 1 VIEW COMPARISON:  June 02, 2020 FINDINGS: The cardiomediastinal silhouette is unchanged in contour.Elevation of the RIGHT hemidiaphragm. No pleural effusion. No pneumothorax. Increased RIGHT middle lobe linear opacity. Increased LEFT lower lung predominately linear opacity. Visualized abdomen is unremarkable. Degenerative changes of bilateral shoulders. IMPRESSION: Increased RIGHT middle lobe and LEFT lower lung predominately linear opacities, favored to represent atelectasis. Superimposed infection or aspiration remains in the differential. Electronically Signed   By: Meda Klinefelter MD   On: 06/05/2020 09:38    My interpretation of Electrocardiogram: Sinus rhythm in the 90s.  Normal axis.  Intervals are normal.  No concerning ST or T wave changes.   Problem List  Principal Problem:   CAP (community acquired pneumonia) Active Problems:   RUQ pain   Transaminitis   Essential hypertension   Acute respiratory failure with hypoxia (HCC)   Assessment: This is a 79 year old Caucasian female with past medical history as stated earlier who comes in with a 2-week history of poor appetite nausea episodes of vomiting right upper quadrant pain and then noted to be hypoxic.  It is quite possible patient may have aspirated which may have resulted in hypoxia.  She however is remarkably tender in the right upper quadrant.  Will need further evaluation for same as well.  Plan:  1.  Right upper quadrant abdominal pain with transaminitis: She did have elevated AST ALT and bilirubin few days ago.  Those markers have improved.  CT scan of the abdomen done at that time did suggest inflammation in the gallbladder however her ultrasound was not remarkable for cholecystitis.  No gallstones were noted either.  She also had fever.  Today she has significant leukocytosis.  This could be due to her respiratory process but I have a strong suspicion for an intra-abdominal process specifically in her gallbladder.  We will proceed with a HIDA scan for now.  Keep her n.p.o. for now.  Add Flagyl to her antibiotic regimen.  2. Pneumonia likely community-acquired versus aspiration/acute respiratory failure with hypoxia: Continue with ceftriaxone and azithromycin.  Continue supplemental oxygen as well.  3. Mild dehydration with hyponatremia: She will be gently hydrated.  Recheck labs tomorrow morning.  4.  Hyperglycemia: Denies any history of diabetes.  We will check a fasting level and a HbA1c.  5. History of essential hypertension: Noted to be on amlodipine at home which will be held.  6. History of acid reflux disease: Continue with PPI  7. History of anxiety disorder: Noted to be on Celexa which will be continued.  Hold her amitriptyline for now.  8. Hyperlipidemia: Hold her statin for now.   DVT Prophylaxis: Lovenox Code Status: Full code Family Communication: Discussed with the patient Disposition: Hopefully return home in improved Consults called: None yet Admission Status: Status is: Inpatient  Remains inpatient appropriate because:Ongoing diagnostic testing needed not appropriate for outpatient work up and Inpatient level of care appropriate due to severity of illness   Dispo: The patient is from: Home              Anticipated d/c is to: Home              Anticipated d/c date is: 3 days              Patient currently is not medically stable to d/c.    Severity of  Illness: The appropriate patient status for this patient is INPATIENT. Inpatient status is judged to be reasonable and necessary in order to provide the required intensity of service to ensure the patient's safety. The patient's presenting symptoms, physical exam findings, and initial radiographic and laboratory data in the context of their chronic comorbidities is felt to place them at high risk for further clinical deterioration. Furthermore, it is not anticipated that the patient will be medically stable for discharge from the hospital within 2 midnights of admission. The following factors support the patient status of inpatient.   " The patient's presenting symptoms include abdominal pain, shortness of breath. " The worrisome physical exam findings include hypoxia, abdominal tenderness. " The initial radiographic and laboratory data are worrisome because of pneumonia, abnormal LFTs. " The chronic co-morbidities include hypertension.   * I certify that at the point of admission it is my clinical judgment that the patient will require inpatient hospital care spanning beyond 2 midnights from the point of admission due to high intensity of service, high risk for further deterioration and high frequency of surveillance required.*   Further management decisions will depend on results of further testing and patient's response to treatment.   Trina Asch [employer]  [facility] [employer] on [email or web address]  06/05/2020, 5:17 PM

## 2020-06-05 NOTE — ED Notes (Signed)
ED Provider at bedside. 

## 2020-06-05 NOTE — ED Triage Notes (Addendum)
RUQ abd pain since leaving here 2 days ago for the same complaint.  Sts she had a "full workup" then and they didn't find anything.  When she got home she started having a sharp pain in RLQ and has had it ever since.  No V/D but slight nausea. Sts "I can't even straighten up."

## 2020-06-05 NOTE — ED Notes (Signed)
Attempted to call report. Unable to speak with RN at this time. Left call back number.

## 2020-06-05 NOTE — ED Provider Notes (Signed)
[facility] EMERGENCY DEPARTMENT Provider Note   CSN: [id number] Arrival date & time: 06/05/20  0830     History Chief Complaint  Patient presents with  . Abdominal Pain    Margaret Cross is a 79 y.o. female.  HPI 79 year old female with abdominal pain since 8/2.  It is right upper quadrant.  Worse with palpation and deep inspiration or cough.  She states she has a chronic cough from reflux but no new cough.  Today she is feeling short of breath and the pain seems worse.  No vomiting but she feels nauseated.  She was seen here on 8/2 after it occurred and had fairly unremarkable work-up including CT and right upper quadrant ultrasound.   Past Medical History:  Diagnosis Date  . Depression   . GERD (gastroesophageal reflux disease)   . High cholesterol   . Hypertension     Patient Active Problem List   Diagnosis Date Noted  . CAP (community acquired pneumonia) 06/05/2020    Past Surgical History:  Procedure Laterality Date  . ABDOMINAL HYSTERECTOMY    . KIDNEY SURGERY       OB History   No obstetric history on file.     No family history on file.  Social History   Tobacco Use  . Smoking status: Never Smoker  . Smokeless tobacco: Never Used  Substance Use Topics  . Alcohol use: Not Currently  . Drug use: Never    Home Medications Prior to Admission medications   Medication Sig Start Date End Date Taking? Authorizing Provider  amLODipine (NORVASC) 10 MG tablet Take 10 mg by mouth daily. 03/13/20   [provider]  citalopram (CELEXA) 40 MG tablet  02/15/18   [provider]  DEXILANT 60 MG capsule  02/15/18   [provider]  ondansetron (ZOFRAN ODT) 4 MG disintegrating tablet 4mg  ODT q4 hours prn nausea/vomit 06/02/20   08/02/20, PA-C  RESTASIS 0.05 % ophthalmic emulsion  02/14/18   [provider]  rosuvastatin (CRESTOR) 5 MG tablet TK 1 T PO ON MWF 12/14/17   [provider]    Allergies     Bupropion and Sulfa antibiotics  Review of Systems   Review of Systems  Constitutional: Negative for fever.  Respiratory: Positive for cough and shortness of breath.   Cardiovascular: Negative for chest pain.  Gastrointestinal: Positive for abdominal pain. Negative for vomiting.  Musculoskeletal: Negative for back pain.  All other systems reviewed and are negative.   Physical Exam Updated Vital Signs BP 135/70 (BP Location: Right Arm)   Pulse 84   Temp 98 F (36.7 C) (Oral)   Resp 19   Ht 5\' 7"  (1.702 m)   Wt 90.2 kg   SpO2 100%   BMI 31.15 kg/m   Physical Exam Vitals and nursing note reviewed.  Constitutional:      Appearance: She is well-developed.  HENT:     Head: Normocephalic and atraumatic.     Right Ear: External ear normal.     Left Ear: External ear normal.     Nose: Nose normal.  Eyes:     General:        Right eye: No discharge.        Left eye: No discharge.  Cardiovascular:     Rate and Rhythm: Regular rhythm. Tachycardia present.     Heart sounds: Normal heart sounds.  Pulmonary:     Effort: Pulmonary effort is normal. No tachypnea or accessory muscle  usage.     Breath sounds: Examination of the right-lower field reveals rales. Rales present.  Abdominal:     Palpations: Abdomen is soft.     Tenderness: There is abdominal tenderness in the right upper quadrant.  Skin:    General: Skin is warm and dry.  Neurological:     Mental Status: She is alert.  Psychiatric:        Mood and Affect: Mood is not anxious.     ED Results / Procedures / Treatments   Labs (all labs ordered are listed, but only abnormal results are displayed) Labs Reviewed  COMPREHENSIVE METABOLIC PANEL - Abnormal; Notable for the following components:      Result Value   Sodium 132 (*)    Chloride 97 (*)    Glucose, Bld 200 (*)    Creatinine, Ser 1.13 (*)    Albumin 3.4 (*)    ALT 75 (*)    GFR calc non Af Amer 46 (*)    GFR calc Af Amer 53 (*)    All other components  within normal limits  CBC WITH DIFFERENTIAL/PLATELET - Abnormal; Notable for the following components:   WBC 21.0 (*)    Neutro Abs 18.9 (*)    Abs Immature Granulocytes 0.34 (*)    All other components within normal limits  URINALYSIS, ROUTINE W REFLEX MICROSCOPIC - Abnormal; Notable for the following components:   Specific Gravity, Urine <1.005 (*)    Bilirubin Urine MODERATE (*)    Protein, ur 30 (*)    All other components within normal limits  URINALYSIS, MICROSCOPIC (REFLEX) - Abnormal; Notable for the following components:   Bacteria, UA FEW (*)    All other components within normal limits  SARS CORONAVIRUS 2 BY RT PCR (HOSPITAL ORDER, PERFORMED IN [facility])  CULTURE, BLOOD (ROUTINE X 2)  CULTURE, BLOOD (ROUTINE X 2)  LACTIC ACID, PLASMA  LIPASE, BLOOD    EKG EKG Interpretation  Date/Time:  Thursday June 05 2020 09:17:23 EDT Ventricular Rate:  91 PR Interval:    QRS Duration: 119 QT Interval:  394 QTC Calculation: 485 R Axis:   -7 Text Interpretation: Sinus rhythm Atrial premature complex Incomplete left bundle branch block Low voltage, precordial leads Baseline wander in lead(s) I III aVR aVL V2 V3 similar to Jun 02 2020 Confirmed by Pricilla Loveless [phone]) on 06/05/2020 9:26:33 AM   Radiology CT Angio Chest PE W and/or Wo Contrast  Result Date: 06/05/2020 CLINICAL DATA:  Right upper quadrant pain EXAM: CT ANGIOGRAPHY CHEST WITH CONTRAST TECHNIQUE: Multidetector CT imaging of the chest was performed using the standard protocol during bolus administration of intravenous contrast. Multiplanar CT image reconstructions and MIPs were obtained to evaluate the vascular anatomy. CONTRAST:  OMNIPAQUE IOHEXOL 350 MG/ML SOLN COMPARISON:  None. FINDINGS: Cardiovascular: No filling defects in the pulmonary arteries to suggest pulmonary emboli. Aortic atherosclerosis and coronary artery disease. Heart is normal size. Aorta normal caliber. Mediastinum/Nodes: No  mediastinal, hilar, or axillary adenopathy. Moderate-sized hiatal hernia. Thyroid unremarkable. Lungs/Pleura: Bilateral lower lobe airspace opacities, right greater than left could reflect atelectasis or infiltrate/pneumonia. Trace right pleural effusion. Upper Abdomen: Calcifications in the spleen compatible with old granulomatous disease. Several small calcified splenic artery aneurysms measuring up to 12 mm. No acute findings. Musculoskeletal: Chest wall soft tissues are unremarkable. Mild compression fracture through the superior endplate of a lower thoracic vertebral body, likely T10. Review of the MIP images confirms the above findings. IMPRESSION: No evidence of pulmonary  embolus. Bilateral lower lobe airspace opacities, right greater than left which could reflect atelectasis or pneumonia. Trace right pleural effusion. Coronary artery disease. 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 Moderate-sized hiatal hernia. Mild compression fracture through the superior endplate at T10, age indeterminate. Aortic Atherosclerosis (ICD10-I70.0). Electronically Signed   By: Charlett Nose M.D.   On: 06/05/2020 10:34   DG Chest Port 1 View  Result Date: 06/05/2020 CLINICAL DATA:  RIGHT upper quadrant pain EXAM: PORTABLE CHEST 1 VIEW COMPARISON:  June 02, 2020 FINDINGS: The cardiomediastinal silhouette is unchanged in contour.Elevation of the RIGHT hemidiaphragm. No pleural effusion. No pneumothorax. Increased RIGHT middle lobe linear opacity. Increased LEFT lower lung predominately linear opacity. Visualized abdomen is unremarkable. Degenerative changes of bilateral shoulders. IMPRESSION: Increased RIGHT middle lobe and LEFT lower lung predominately linear opacities, favored to represent atelectasis. Superimposed infection or aspiration remains in the differential. Electronically Signed   By: Meda Klinefelter MD   On: 06/05/2020 09:38     Procedures Procedures (including critical care time)  Medications Ordered in ED Medications  0.9 %  sodium chloride infusion ( Intravenous New Bag/Given 06/05/20 1044)  sodium chloride 0.9 % bolus 1,000 mL ( Intravenous Stopped 06/05/20 1144)  fentaNYL (SUBLIMAZE) injection 50 mcg (50 mcg Intravenous Given 06/05/20 1014)  cefTRIAXone (ROCEPHIN) 1 g in sodium chloride 0.9 % 100 mL IVPB ( Intravenous Stopped 06/05/20 1121)  azithromycin (ZITHROMAX) 500 mg in sodium chloride 0.9 % 250 mL IVPB (500 mg Intravenous New Bag/Given 06/05/20 1200)  iohexol (OMNIPAQUE) 350 MG/ML injection 100 mL (100 mLs Intravenous Contrast Given 06/05/20 1020)    ED Course  I have reviewed the triage vital signs and the nursing notes.  Pertinent labs & imaging results that were available during my care of the patient were reviewed by me and considered in my medical decision making (see chart for details).    MDM Rules/Calculators/A&P                          Patient is mildly hypoxic to 88% on room air. Given this along with right-sided crackles, this seems to be most consistent with right-sided pneumonia causing the right upper quadrant abdominal pain. CT angiography obtained given equivocal chest x-ray and does seem to show pneumonia, especially on my view. No pulmonary embolus.  She is asking if she can be admitted to [facility] as personal preference, but unfortunately do not have any beds and are holding in the emergency department.  [facility] admit to [facility].  Discussed with Dr. Rito Ehrlich. Final Clinical Impression(s) / ED Diagnoses Final diagnoses:  Community acquired pneumonia of right lower lobe of lung    Rx / DC Orders ED Discharge Orders    None       Pricilla Loveless, MD 06/05/20 1500

## 2020-06-06 ENCOUNTER — Inpatient Hospital Stay (HOSPITAL_COMMUNITY): Payer: Medicare Other

## 2020-06-06 DIAGNOSIS — R7401 Elevation of levels of liver transaminase levels: Secondary | ICD-10-CM

## 2020-06-06 DIAGNOSIS — F419 Anxiety disorder, unspecified: Secondary | ICD-10-CM

## 2020-06-06 DIAGNOSIS — R739 Hyperglycemia, unspecified: Secondary | ICD-10-CM

## 2020-06-06 DIAGNOSIS — R1011 Right upper quadrant pain: Secondary | ICD-10-CM

## 2020-06-06 LAB — HEPATITIS PANEL, ACUTE
HCV Ab: NONREACTIVE
Hep A IgM: NONREACTIVE
Hep B C IgM: NONREACTIVE
Hepatitis B Surface Ag: NONREACTIVE

## 2020-06-06 LAB — CBC
HCT: 34.8 % — ABNORMAL LOW (ref 36.0–46.0)
Hemoglobin: 11.7 g/dL — ABNORMAL LOW (ref 12.0–15.0)
MCH: 32.6 pg (ref 26.0–34.0)
MCHC: 33.6 g/dL (ref 30.0–36.0)
MCV: 96.9 fL (ref 80.0–100.0)
Platelets: 257 10*3/uL (ref 150–400)
RBC: 3.59 MIL/uL — ABNORMAL LOW (ref 3.87–5.11)
RDW: 13.1 % (ref 11.5–15.5)
WBC: 14.1 10*3/uL — ABNORMAL HIGH (ref 4.0–10.5)
nRBC: 0 % (ref 0.0–0.2)

## 2020-06-06 LAB — COMPREHENSIVE METABOLIC PANEL
ALT: 134 U/L — ABNORMAL HIGH (ref 0–44)
AST: 135 U/L — ABNORMAL HIGH (ref 15–41)
Albumin: 2.4 g/dL — ABNORMAL LOW (ref 3.5–5.0)
Alkaline Phosphatase: 223 U/L — ABNORMAL HIGH (ref 38–126)
Anion gap: 9 (ref 5–15)
BUN: 13 mg/dL (ref 8–23)
CO2: 23 mmol/L (ref 22–32)
Calcium: 8.2 mg/dL — ABNORMAL LOW (ref 8.9–10.3)
Chloride: 102 mmol/L (ref 98–111)
Creatinine, Ser: 0.58 mg/dL (ref 0.44–1.00)
GFR calc Af Amer: >60 mL/min (ref >60)
GFR calc non Af Amer: >60 mL/min (ref >60)
Glucose, Bld: 136 mg/dL — ABNORMAL HIGH (ref 70–99)
Potassium: 3.7 mmol/L (ref 3.5–5.1)
Sodium: 134 mmol/L — ABNORMAL LOW (ref 135–145)
Total Bilirubin: 2.8 mg/dL — ABNORMAL HIGH (ref 0.3–1.2)
Total Protein: 6.4 g/dL — ABNORMAL LOW (ref 6.5–8.1)

## 2020-06-06 LAB — HEMOGLOBIN A1C
Hgb A1c MFr Bld: 6.3 % — ABNORMAL HIGH (ref 4.8–5.6)
Mean Plasma Glucose: 134.11 mg/dL

## 2020-06-06 LAB — HIV ANTIBODY (ROUTINE TESTING W REFLEX): HIV Screen 4th Generation wRfx: NONREACTIVE

## 2020-06-06 LAB — PROTIME-INR
INR: 1.1 (ref 0.8–1.2)
Prothrombin Time: 13.6 seconds (ref 11.4–15.2)

## 2020-06-06 MED ORDER — PANTOPRAZOLE SODIUM 40 MG IV SOLR
40.0000 mg | Freq: Every day | INTRAVENOUS | Status: DC
  Administered 2020-06-06 – 2020-06-09 (×4): 40 mg via INTRAVENOUS
  Filled 2020-06-06 (×4): qty 40

## 2020-06-06 MED ORDER — METOPROLOL TARTRATE 5 MG/5ML IV SOLN
2.5000 mg | Freq: Three times a day (TID) | INTRAVENOUS | Status: DC
  Administered 2020-06-06 – 2020-06-09 (×10): 2.5 mg via INTRAVENOUS
  Filled 2020-06-06 (×10): qty 5

## 2020-06-06 MED ORDER — GADOBUTROL 1 MMOL/ML IV SOLN
9.0000 mL | Freq: Once | INTRAVENOUS | Status: AC | PRN
  Administered 2020-06-06: 7.5 mL via INTRAVENOUS

## 2020-06-06 MED ORDER — SODIUM CHLORIDE 0.9 % IV SOLN
INTRAVENOUS | Status: AC
  Administered 2020-06-06: 1000 mL via INTRAVENOUS

## 2020-06-06 MED ORDER — SODIUM CHLORIDE 0.9 % IV SOLN
500.0000 mg | Freq: Every day | INTRAVENOUS | Status: DC
  Administered 2020-06-06 – 2020-06-07 (×2): 500 mg via INTRAVENOUS
  Filled 2020-06-06 (×3): qty 500

## 2020-06-06 MED ORDER — MORPHINE SULFATE (PF) 2 MG/ML IV SOLN
1.0000 mg | INTRAVENOUS | Status: DC | PRN
  Administered 2020-06-06 (×2): 1 mg via INTRAVENOUS
  Filled 2020-06-06 (×2): qty 1

## 2020-06-06 NOTE — Progress Notes (Addendum)
Flagyl stopped and IV capped for patient to have her MRI with MRCP now. Patient on 2.5L O2.

## 2020-06-06 NOTE — Progress Notes (Signed)
Occupational Therapy Evaluation  Patient with functional deficits listed below impacting safety and independence with self care. Supervision with verbal cues sequence log roll for bed mobility to minimize pain/strain to abdomen, supervision to stand and min G with transfer to toilet with slight LOB turning to toilet. Trial patient on room air with toilet transfer, once seated in recliner pt at 80% on room air, donned 3L and cue in pursed lipped breathing techniques with return to 93% ~1 min. Will continue to follow acutely to maximize safety and independence with self care. Do not anticipate follow up OT needs at discharge, has DTR staying with her currently.    06/06/20 0900  OT Visit Information  Last OT Received On 06/06/20  Assistance Needed +1  History of Present Illness 79 y.o. female with a past medical history of anxiety disorder, essential hypertension who was in her usual state of health till about 2 weeks ago when she noticed that she had lost her appetite. had bouts of N/V ~1 week ago. about 3 days prior to this admission patient started developing abdominal pain in the right upper quadrant. Patient diagnosed with Pneumonia likely community-acquired and having further work up for suspecting intra-abdominal process   Precautions  Precautions Fall  Precaution Comments monitor O2  Restrictions  Weight Bearing Restrictions No  Home Living  Family/patient expects to be discharged to: Private residence  Living Arrangements Children (DTR temporarily, shes looking for house in [place])  Available Help at Discharge Family  Type of Home House  Home Access Stairs to enter  Entrance Stairs-Number of Steps 2 Front 1 back  Home Layout Two level;Able to live on main level with bedroom/bathroom  Higher education careers adviser Grab bars - tub/shower  Prior Function  Level of Independence Independent  Comments patient was out driving, getting  groceries prior to onset of N/V abdominal pain  Communication  Communication No difficulties  Pain Assessment  Pain Assessment Faces  Faces Pain Scale 4  Pain Location abdomen  Pain Descriptors / Indicators Guarding;Sore  Pain Intervention(s) Monitored during session  Cognition  Arousal/Alertness Awake/alert  Behavior During Therapy WFL for tasks assessed/performed  Overall Cognitive Status Within Functional Limits for tasks assessed  Upper Extremity Assessment  Upper Extremity Assessment Overall WFL for tasks assessed  Lower Extremity Assessment  Lower Extremity Assessment Defer to PT evaluation  Cervical / Trunk Assessment  Cervical / Trunk Assessment Normal  ADL  Overall ADL's  Needs assistance/impaired  Grooming Wash/dry hands;Supervision/safety;Standing  Upper Body Bathing Set up;Sitting  Lower Body Bathing Moderate assistance;Sitting/lateral leans;Sit to/from stand  Upper Body Dressing  Set up;Sitting  Lower Body Dressing Total assistance;Sitting/lateral leans  Lower Body Dressing Details (indicate cue type and reason) to don socks due to abdominal pain, could not don in long sitting or at EOB. educate patient on sock aid, pt reports has seen them on TV  Science writer;Ambulation  Toilet Transfer Details (indicate cue type and reason) x1 minor loss of balance when turing to sit, min G for safety  Toileting- Clothing Manipulation and Hygiene Independent;Sitting/lateral lean  Functional mobility during ADLs Min guard;Cueing for safety  Bed Mobility  Overal bed mobility Needs Assistance  Bed Mobility Rolling;Sidelying to Sit  Rolling Supervision  Sidelying to sit Supervision;HOB elevated  General bed mobility comments verbal cues for techique to minimize abdominal pain  Transfers  Overall transfer level Needs assistance  Equipment used None  Transfers Sit to/from Stand  Sit  to Stand Supervision;Min guard  General transfer comment no physical  assist to stand from EOB or toilet, min G when turning to sit onto toilet due to minor loss of balance  Balance  Overall balance assessment Mild deficits observed, not formally tested  General Comments  General comments (skin integrity, edema, etc.) trialed patient on room air as she does not have O2 at home, dropped to 80%. Donned the 3L and cued patient in pursed lip breathing with return to 93% after ~1 min seated rest in recliner  OT - End of Session  Equipment Utilized During Treatment Oxygen  Activity Tolerance Patient tolerated treatment well  Patient left in chair;with call bell/phone within reach;with chair alarm set  Nurse Communication Mobility status  OT Assessment  OT Recommendation/Assessment Patient needs continued OT Services  OT Visit Diagnosis Other abnormalities of gait and mobility (R26.89);Pain  Pain - Right/Left Right  Pain - part of body  (upper abdomen)  OT Problem List Decreased activity tolerance;Impaired balance (sitting and/or standing);Pain  OT Plan  OT Frequency (ACUTE ONLY) Min 2X/week  OT Treatment/Interventions (ACUTE ONLY) Self-care/ADL training;Energy conservation;DME and/or AE instruction;Therapeutic activities;Patient/family education;Balance training  AM-PAC OT "6 Clicks" Daily Activity Outcome Measure (Version 2)  Help from another person eating meals? 4  Help from another person taking care of personal grooming? 3  Help from another person toileting, which includes using toliet, bedpan, or urinal? 3  Help from another person bathing (including washing, rinsing, drying)? 2  Help from another person to put on and taking off regular upper body clothing? 3  Help from another person to put on and taking off regular lower body clothing? 1  6 Click Score 16  OT Recommendation  Follow Up Recommendations No OT follow up  OT Equipment Other (comment) (LH AE)  Individuals Consulted  Consulted and Agree with Results and Recommendations Patient  Acute  Rehab OT Goals  Patient Stated Goal less pain  OT Goal Formulation With patient  Time For Goal Achievement 06/20/20  Potential to Achieve Goals Good  OT Time Calculation  OT Start Time (ACUTE ONLY) 0758  OT Stop Time (ACUTE ONLY) 9373  OT Time Calculation (min) 24 min  OT General Charges  $OT Visit 1 Visit  OT Evaluation  $OT Eval Low Complexity 1 Low  OT Treatments  $Self Care/Home Management  8-22 mins  Written Expression  Dominant Hand Right   Marlyce Huge OT OT pager: [phone]

## 2020-06-06 NOTE — Evaluation (Signed)
Physical Therapy Evaluation Patient Details Name: Margaret Cross MRN: [id number] DOB: 02-Feb-1941 Today's Date: 06/06/2020   History of Present Illness  79 y.o. female with a past medical history of anxiety disorder, essential hypertension. pt admitted with c/o R UQ pain,  fatigue, N/V, dx with CAP/hypoxia, transaminitis, Concern for gallbladder disease versus biliary disease  Clinical Impression  Pt admitted with above diagnosis.  Pt very pleasant and motivated to mobilize despite pain. Pt is independent and active at her baseline.  amb hallway distance with IV pole, SpO2 = 87-88% on RA, incr to low 90s with O2 replaced at 2.5L.  Will follow in acute setting.  No f/u recommended post acute. Pt currently with functional limitations due to the deficits listed below (see PT Problem List). Pt will benefit from skilled PT to increase their independence and safety with mobility to allow discharge to the venue listed below.       Follow Up Recommendations No PT follow up    Equipment Recommendations  None recommended by PT    Recommendations for Other Services       Precautions / Restrictions Precautions Precautions: Fall Precaution Comments: monitor O2      Mobility  Bed Mobility   Bed Mobility: Rolling;Sidelying to Sit;Sit to Sidelying Rolling: Modified independent (Device/Increase time) Sidelying to sit: Modified independent (Device/Increase time)     Sit to sidelying: Modified independent (Device/Increase time) General bed mobility comments: use of rail, pt utilizes log roll technique without cues  Transfers Overall transfer level: Needs assistance Equipment used: None Transfers: Sit to/from Stand Sit to Stand: Supervision;Min guard         General transfer comment: for safety from bed and toilet  Ambulation/Gait Ambulation/Gait assistance: Min guard;Min assist Gait Distance (Feet): 75 Feet Assistive device: IV Pole Gait Pattern/deviations: Step-through  pattern;Drifts right/left     General Gait Details: unsteady initially, improved with distance, incr time with turns d/t abd pain  Stairs            Wheelchair Mobility    Modified Rankin (Stroke Patients Only)       Balance Overall balance assessment: Needs assistance   Sitting balance-Leahy Scale: Good       Standing balance-Leahy Scale: Fair Standing balance comment: Fair to good --->not tested to mod challenges                             Pertinent Vitals/Pain Pain Assessment: Faces Faces Pain Scale: Hurts even more Pain Location: abdomen Pain Descriptors / Indicators: Guarding;Sore Pain Intervention(s): Limited activity within patient's tolerance;Monitored during session    Home Living Family/patient expects to be discharged to:: Private residence Living Arrangements: Children Available Help at Discharge: Family Type of Home: House Home Access: Stairs to enter   Secretary/administrator of Steps: 2 Front 1 back Home Layout: Two level;Able to live on main level with bedroom/bathroom (bonus room/bedroom upstairs) Home Equipment: Dan Humphreys - 2 wheels;Cane - single point Additional Comments: dtr just moved  in with pt (from [place]) temporarily while she is  looking for a house in [place]    Prior Function Level of Independence: Independent         Comments: patient was out driving, getting groceries prior to onset of N/V abdominal pain     Hand Dominance        Extremity/Trunk Assessment   Upper Extremity Assessment Upper Extremity Assessment: Overall WFL for tasks assessed;Defer to OT evaluation    Lower  Extremity Assessment Lower Extremity Assessment: Overall WFL for tasks assessed       Communication   Communication: No difficulties  Cognition Arousal/Alertness: Awake/alert Behavior During Therapy: WFL for tasks assessed/performed Overall Cognitive Status: Within Functional Limits for tasks assessed                                         General Comments      Exercises     Assessment/Plan    PT Assessment Patient needs continued PT services  PT Problem List Decreased mobility;Decreased activity tolerance;Pain;Cardiopulmonary status limiting activity       PT Treatment Interventions DME instruction;Therapeutic exercise;Gait training;Functional mobility training;Therapeutic activities;Patient/family education    PT Goals (Current goals can be found in the Care Plan section)  Acute Rehab PT Goals Patient Stated Goal: less pain, be able to eat and drink PT Goal Formulation: With patient Time For Goal Achievement: 06/20/20 Potential to Achieve Goals: Good    Frequency Min 3X/week   Barriers to discharge        Co-evaluation               AM-PAC PT "6 Clicks" Mobility  Outcome Measure Help needed turning from your back to your side while in a flat bed without using bedrails?: None Help needed moving from lying on your back to sitting on the side of a flat bed without using bedrails?: None Help needed moving to and from a bed to a chair (including a wheelchair)?: A Little Help needed standing up from a chair using your arms (e.g., wheelchair or bedside chair)?: A Little Help needed to walk in hospital room?: A Little Help needed climbing 3-5 steps with a railing? : A Little 6 Click Score: 20    End of Session Equipment Utilized During Treatment: Gait belt Activity Tolerance: Patient tolerated treatment well Patient left: with call bell/phone within reach;in bed   PT Visit Diagnosis: Other abnormalities of gait and mobility (R26.89)    Time: 7681-1572 PT Time Calculation (min) (ACUTE ONLY): 22 min   Charges:   PT Evaluation $PT Eval Low Complexity: 1 Low          Kennadee Walthour, PT  Acute Rehab Dept ([facility]) [phone] Pager [phone]  06/06/2020   [facility] 06/06/2020, 2:21 PM

## 2020-06-06 NOTE — Consult Note (Addendum)
Referring Provider: Dr. Irine Seal  Primary Care Physician:  Basilio Cairo, MD Primary Gastroenterologist:  Althia Forts   Reason for Consultation:  Abdominal pain, elevated LFTs  HPI: Margaret Cross is a 79 y.o. female with a past medical history of anxiety, depression, arthritis, hypercholesterolemia, hypertension and GERD.  Past hysterectomy.  She was seen at [facility] ED on 06/02/2020 with N/V and RUQ pain x 6 days. Emesis was nonbloody. Labs in the ED showed K+ 3.3. AST 76. ALT 234. Alk phos 152. T. Bili 1.6. Lactic acid 2.5. WBC 8.7. Hg 14.9. Lipase was normal. A RUQ sonogram without evidence of acute cholecystitis, some fatty liver disease noted. An abdominal/pelvic CT scan showed moderate sliding hiatal hernia, some possible inflammation noted around gallbladder, no evidence of biliary dilatation or obstruction on CT, inflammation not noted on ultrasound which is more sensitive view of the gallbladder.  Calcified aneurysm of splenic artery noted. She received IV fluids, antiemetics, pain medication and Pepcid and her symptoms improved. She was discharged home with the instructions to follow up with GI.  She developed RUQ pain and she presented to [place] ED on 8/5. Labs in the ED showed WBC 21.  Hemoglobin 13.5.  AST 21.  ALT 75.  Total bili 1.2.  She also complained of having a cough shortness of breath.  A chest x-ray showed atelectasis, superimposed infection was considered in the differential.  She was  transferred to [facility] for further GI evaluation. A chest CT angiogram showed a small splenic artery aneurysm and a moderate hiatal hernia.  A [facility] HIDA scan was ordered but not yet completed.  The patient's RN reported the HIDA scan was likely cannot be completed until Monday next week.   Currently, she continues to have right upper quadrant abdominal pain which worsens if she changes positions.  She ramins NPO at this time.  Her nausea is controlled.  No lower  abdominal pain.  No bowel movement for the past 3 days.  She typically passes a normal brown bowel movement.  No rectal bleeding or melena.  Her urine has been darker orange for the past week. She takes naproxen 1 tab 2-3 times weekly for more than 5 years.  She recently started taking ibuprofen 200 mg 2 tabs once or twice daily for arthritic pain.  She reports history of GERD.  Never had an EGD.  No history of ulcers.  No history of liver disease.  She underwent a colonoscopy completed by a GI in [place] 10 years ago which she reported was normal.  No chest pain or palpitations.  No current shortness of breath.  She is on O2 3 L nasal cannula.  Non-smoker.  She drinks 1 glass of wine every blue moon.  No new medications or herbal supplements over the past 6 months. No fever, sweats or chills.  No weight loss.  No family history of esophageal, gastric, colon, gallbladder or pancreatic cancer.  Chest CT angiogram 06/05/2020: No evidence of pulmonary embolus. Bilateral lower lobe airspace opacities, right greater than left which could reflect atelectasis or pneumonia. Trace right pleural effusion.Coronary artery disease. 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 Moderate-sized hiatal hernia. Mild compression fracture through the superior endplate at H06, age indeterminate. Aortic Atherosclerosis   Abdominal/Pelvic CT 06/02/2020 1. Moderate size sliding-type hiatal hernia. 2. Mild inflammatory changes appear to be present around the gallbladder. No gallstones or biliary dilatation is noted.  HIDA scan may be performed for further evaluation. 3. Possible 9 mm calcified distal splenic artery aneurysm.  RUQ sono 06/02/2020: Sonographic survey negative for cholelithiasis or sonographic evidence of acute cholecystitis. Liver steatosis.    Past Medical History:  Diagnosis Date  . Depression   . GERD (gastroesophageal reflux  disease)   . High cholesterol   . Hypertension     Past Surgical History:  Procedure Laterality Date  . ABDOMINAL HYSTERECTOMY    . KIDNEY SURGERY      Prior to Admission medications   Medication Sig Start Date End Date Taking? Authorizing Provider  acetaminophen (TYLENOL) 500 MG tablet Take 500 mg by mouth every 6 (six) hours as needed for moderate pain.   Yes [provider]  amitriptyline (ELAVIL) 25 MG tablet Take 25 mg by mouth at bedtime as needed for sleep.  05/26/20  Yes [provider]  amLODipine (NORVASC) 10 MG tablet Take 10 mg by mouth daily. 03/13/20  Yes [provider]  calcium carbonate (TUMS - DOSED IN MG ELEMENTAL CALCIUM) 500 MG chewable tablet Chew 1 tablet by mouth 2 (two) times daily as needed for indigestion or heartburn.   Yes [provider]  citalopram (CELEXA) 40 MG tablet Take 40 mg by mouth daily.  02/15/18  Yes [provider]  DEXILANT 60 MG capsule Take 60 mg by mouth daily.  02/15/18  Yes [provider]  famotidine (PEPCID) 40 MG tablet Take 40 mg by mouth daily as needed for heartburn or indigestion.   Yes [provider]  losartan (COZAAR) 50 MG tablet Take 50 mg by mouth daily.   Yes [provider]  ondansetron (ZOFRAN ODT) 4 MG disintegrating tablet 34m ODT q4 hours prn nausea/vomit 06/02/20  Yes Ford, Kelsey N, PA-C  RESTASIS 0.05 % ophthalmic emulsion Place 1 drop into both eyes 2 (two) times daily.  02/14/18  Yes [provider]  rosuvastatin (CRESTOR) 5 MG tablet Take 5 mg by mouth as directed. Take on MWF 12/14/17  Yes [provider]    Current Facility-Administered Medications  Medication Dose Route Frequency Provider Last Rate Last Admin  . 0.9 %  sodium chloride infusion   Intravenous PRN GSherwood Gambler MD   Stopped at 06/05/20 1526  . 0.9 %  sodium chloride infusion   Intravenous Continuous TEugenie Filler MD      . acetaminophen (TYLENOL) tablet 650  mg  650 mg Oral Q6H PRN KBonnielee Haff MD       Or  . acetaminophen (TYLENOL) suppository 650 mg  650 mg Rectal Q6H PRN KBonnielee Haff MD      . azithromycin (ZITHROMAX) 500 mg in sodium chloride 0.9 % 250 mL IVPB  500 mg Intravenous Daily TEugenie Filler MD      . cefTRIAXone (ROCEPHIN) 2 g in sodium chloride 0.9 % 100 mL IVPB  2 g Intravenous Q24H KBonnielee Haff MD 200 mL/hr at 06/06/20 1025 2 g at 06/06/20 1025  . citalopram (CELEXA) tablet 40 mg  40 mg Oral Daily TEugenie Filler MD   40 mg at 06/06/20 1026  . enoxaparin (LOVENOX) injection 40 mg  40 mg Subcutaneous Q24H KBonnielee Haff MD   40 mg at 06/05/20 1835  . metoprolol tartrate (LOPRESSOR) injection 2.5 mg  2.5 mg Intravenous Q8H TEugenie Filler MD   2.5 mg at 06/06/20 1040  . metroNIDAZOLE (FLAGYL) IVPB 500 mg  500 mg Intravenous Q8H KBonnielee Haff MD 100 mL/hr at 06/06/20  0856 500 mg at 06/06/20 0856  . morphine 2 MG/ML injection 1 mg  1 mg Intravenous Q4H PRN Eugenie Filler, MD   1 mg at 06/06/20 6384  . ondansetron (ZOFRAN) tablet 4 mg  4 mg Oral Q6H PRN Bonnielee Haff, MD       Or  . ondansetron [facility]) injection 4 mg  4 mg Intravenous Q6H PRN Bonnielee Haff, MD      . oxyCODONE (Oxy IR/ROXICODONE) immediate release tablet 5 mg  5 mg Oral Q4H PRN Bonnielee Haff, MD   5 mg at 06/05/20 2232  . pantoprazole (PROTONIX) injection 40 mg  40 mg Intravenous Daily Eugenie Filler, MD   40 mg at 06/06/20 1027    Allergies as of 06/05/2020 - Review Complete 06/05/2020  Allergen Reaction Noted  . Bupropion Other (See Comments) 12/19/2018  . Sulfa antibiotics Nausea And Vomiting 11/26/2015    Family History  Problem Relation Age of Onset  . Heart disease Father     Social History   Socioeconomic History  . Marital status: Widowed    Spouse name: Not on file  . Number of children: Not on file  . Years of education: Not on file  . Highest education level: Not on file  Occupational History  . Not  on file  Tobacco Use  . Smoking status: Never Smoker  . Smokeless tobacco: Never Used  Substance and Sexual Activity  . Alcohol use: Not Currently  . Drug use: Never  . Sexual activity: Not on file  Other Topics Concern  . Not on file  Social History Narrative  . Not on file   Social Determinants of Health   Financial Resource Strain:   . Difficulty of Paying Living Expenses:   Food Insecurity:   . Worried About Charity fundraiser in the Last Year:   . Arboriculturist in the Last Year:   Transportation Needs:   . Film/video editor (Medical):   Marland Kitchen Lack of Transportation (Non-Medical):   Physical Activity:   . Days of Exercise per Week:   . Minutes of Exercise per Session:   Stress:   . Feeling of Stress :   Social Connections:   . Frequency of Communication with Friends and Family:   . Frequency of Social Gatherings with Friends and Family:   . Attends Religious Services:   . Active Member of Clubs or Organizations:   . Attends Archivist Meetings:   Marland Kitchen Marital Status:   Intimate Partner Violence:   . Fear of Current or Ex-Partner:   . Emotionally Abused:   Marland Kitchen Physically Abused:   . Sexually Abused:     Review of Systems: See HPI, all other systems reviewed and are negative  Physical Exam: Vital signs in last 24 hours: Temp:  [98.3 F (36.8 C)-99.5 F (37.5 C)] 98.6 F (37 C) (08/06 0001) Pulse Rate:  [81-105] 82 (08/06 1039) Resp:  [15-22] 17 (08/06 0001) BP: (122-162)/(58-78) 139/72 (08/06 1039) SpO2:  [89 %-100 %] 92 % (08/06 0001) Last BM Date: 06/03/20 General:  Alert,  well-developed, well-nourished, pleasant and cooperative in NAD. Head:  Normocephalic and atraumatic. Eyes:  Trace scleral icterus. Conjunctiva pink. Ears:  Normal auditory acuity. Nose:  No deformity, discharge or lesions. Mouth:  Dentition intact. No ulcers or lesions.  Neck:  Supple. No lymphadenopathy or thyromegaly.  Lungs: Crackles to the left lower base otherwise  clear. Heart: Regular rate and rhythm, no murmurs. Abdomen: Soft, nondistended.  Moderate epigastric and right upper quadrant tenderness without rebound or guarding.  Positive bowel sounds to all 4 quadrants.  No mass.  No HSM. Rectal: Deferred. Musculoskeletal:  Symmetrical without gross deformities.  Pulses:  Normal pulses noted. Extremities:  Without clubbing or edema. Neurologic:  Alert and  oriented x4. No focal deficits.  Skin:  Intact without significant lesions or rashes. Psych:  Alert and cooperative. Normal mood and affect.  Lab Results: Recent Labs    06/05/20 0901 06/06/20 0524  WBC 21.0* 14.1*  HGB 13.5 11.7*  HCT 40.2 34.8*  PLT 206 257   BMET Recent Labs    06/05/20 0901 06/06/20 0524  NA 132* 134*  K 3.7 3.7  CL 97* 102  CO2 23 23  GLUCOSE 200* 136*  BUN 21 13  CREATININE 1.13* 0.58  CALCIUM 9.6 8.2*   LFT Recent Labs    06/06/20 0524  PROT 6.4*  ALBUMIN 2.4*  AST 135*  ALT 134*  ALKPHOS 223*  BILITOT 2.8*   PT/INR Recent Labs    06/06/20 0524  LABPROT 13.6  INR 1.1   Hepatitis Panel No results for input(s): HEPBSAG, HCVAB, HEPAIGM, HEPBIGM in the last 72 hours.    Studies/Results: CT Angio Chest PE W and/or Wo Contrast  Result Date: 06/05/2020 CLINICAL DATA:  Right upper quadrant pain EXAM: CT ANGIOGRAPHY CHEST WITH CONTRAST TECHNIQUE: Multidetector CT imaging of the chest was performed using the standard protocol during bolus administration of intravenous contrast. Multiplanar CT image reconstructions and MIPs were obtained to evaluate the vascular anatomy. CONTRAST:  111m OMNIPAQUE IOHEXOL 350 MG/ML SOLN COMPARISON:  None. FINDINGS: Cardiovascular: No filling defects in the pulmonary arteries to suggest pulmonary emboli. Aortic atherosclerosis and coronary artery disease. Heart is normal size. Aorta normal caliber. Mediastinum/Nodes: No mediastinal, hilar, or axillary adenopathy. Moderate-sized hiatal hernia. Thyroid unremarkable.  Lungs/Pleura: Bilateral lower lobe airspace opacities, right greater than left could reflect atelectasis or infiltrate/pneumonia. Trace right pleural effusion. Upper Abdomen: Calcifications in the spleen compatible with old granulomatous disease. Several small calcified splenic artery aneurysms measuring up to 12 mm. No acute findings. Musculoskeletal: Chest wall soft tissues are unremarkable. Mild compression fracture through the superior endplate of a lower thoracic vertebral body, likely T10. Review of the MIP images confirms the above findings. IMPRESSION: No evidence of pulmonary embolus. Bilateral lower lobe airspace opacities, right greater than left which could reflect atelectasis or pneumonia. Trace right pleural effusion. Coronary artery disease. 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 Moderate-sized hiatal hernia. Mild compression fracture through the superior endplate at TU72 age indeterminate. Aortic Atherosclerosis (ICD10-I70.0). Electronically Signed   By: KRolm BaptiseM.D.   On: 06/05/2020 10:34   DG Chest Port 1 View  Result Date: 06/05/2020 CLINICAL DATA:  RIGHT upper quadrant pain EXAM: PORTABLE CHEST 1 VIEW COMPARISON:  June 02, 2020 FINDINGS: The cardiomediastinal silhouette is unchanged in contour.Elevation of the RIGHT hemidiaphragm. No pleural effusion. No pneumothorax. Increased RIGHT middle lobe linear opacity. Increased LEFT lower lung predominately linear opacity. Visualized abdomen is unremarkable. Degenerative changes of bilateral shoulders. IMPRESSION: Increased RIGHT middle lobe and LEFT lower lung predominately linear opacities, favored to represent atelectasis. Superimposed infection or aspiration remains in the differential. Electronically Signed   By: SValentino SaxonMD   On: 06/05/2020 09:38    IMPRESSION/PLAN:  158  79year old female with N/V and RUQ abdominal pain with elevated LFTs consistent with  acute cholestasis.  Concerning for cholecystitis vs  choledocholithiasis.  LFTs rising.  Today AST 135.  ALT 134.  Total bili 2.8.  Alk phos 223. -NPO -IVF per the hospitalist -Abdominal MRI/MRCP w/wo contrast stat  -Further recommendations to be determined after abd MRI/MRCP results received  -Acute hepatitis panel, ANA, AMA, IgG, iron, ferritin and alpha 1 antitrypsin level -Repeat hepatic panel in am   2. GERD. Chronic NSAID use. -No NSAIDs advised. -PPI  3. Leukocytosis, most likely related to # 1. WBC 21 -> 14.1.  She is afebrile. On Rocephin 24m IV Q 24 hrs.   4. Cough/SOB on 3L [place], questionable pneumonia. On Zithromax IV  Further recommendations per Dr. JMarylee FlorasMDorathy Daft 06/06/2020, 11:23 AM   ________________________________________________________________________  LVelora HecklerGI MD note:  I personally examined the patient, reviewed the data and agree with the assessment and plan described above.  A HIDA scan was originally ordered by hospitalist however I am told there is a shortage of appropriate contrast agent and so the test cannot be done until Monday, unless we discharge her from the hospital then it can be done as an outpatient.  Instead we are ordering MRI with MRCP for further evaluation of her abd pains, nausea, elevated liver tests. Also we've ordered bloodwork for acute viral workup, autoimmune markers, etc.  She understands that if these tests are not helpful she may require further testing.   DOwens Loffler MD L[facility]ology Pager [phone]

## 2020-06-06 NOTE — Significant Event (Signed)
The radiologist notified me that the MRCP showed acute emphysematous cholecystitis.  Discussed with on-call general surgeon Dr. Gerrit Friends who will be seeing patient in consult.  Patient is n.p.o. and on antibiotics.  Hemodynamically stable at this time.  Midge Minium

## 2020-06-06 NOTE — Progress Notes (Signed)
PROGRESS NOTE    Margaret Cross  ZOX:[id number] DOB: 10/06/1941 DOA: 06/05/2020 PCP: Harold BarbanPearson, Darren J, MD    Chief Complaint  Patient presents with  . Abdominal Pain    Brief Narrative:  HPI per Dr. Jacqulyn CaneKrishnan Margaret Cross is a 79 y.o. female with a past medical history of anxiety disorder, essential hypertension who was in her usual state of health till about 2 weeks ago when she noticed that she had lost her appetite.  She felt fatigued.   She also had bouts of nausea and vomiting about a week ago. And then about 3 days prior to this admission patient started developing abdominal pain in the right upper quadrant along with some nausea.    She went to the emergency department and was evaluated and was noted to have abnormal LFTs.  A CT abdomen raised concern for inflammation in the gallbladder.  Abdominal ultrasound however did not show any acute cholecystitis.  Patient was discharged home.  She came back to the emergency department today due to persistent symptoms.  She has also developed some shortness of breath. She has chronic acid reflux and has a chronic cough as a result of the same. Her cough really has not changed much.  Evaluation done today showed improvement in the LFTs.  A CT angiogram was done which did not show any PE but raised concern for possible infectious process in the lung.  It was felt that perhaps this was causing her abdominal symptoms.  Patient currently denies any chest pain.  She continues to have pain in the right upper abdomen which she rates at 6 out of 10 in intensity without any radiation.  No precipitating aggravating or relieving factors.  She did have fever last week as well.  She has noticed that she tends to get a little short of breath when she talks a lot.  Denies any diarrhea.  See above for ED course.  Assessment & Plan:   Principal Problem:   RUQ pain Active Problems:   Transaminitis   Acute respiratory failure with hypoxia (HCC)   CAP  (community acquired pneumonia)   Essential hypertension  1 right upper quadrant abdominal pain/transaminitis Concern for gallbladder disease versus biliary disease.  Patient with a prior 2-week history of nausea decreased appetite, emesis, right upper quadrant abdominal pain noted to have elevated transaminitis.  CT abdomen done did suggest some inflammation in the gallbladder however right upper quadrant ultrasound done a few days ago was negative for acute cholecystitis.  Patient with right upper quadrant pain.  Patient also noted to have a low-grade fever.  Patient on admission with a leukocytosis, worsening transaminitis.  Patient currently n.p.o.  Awaiting HIDA scan for further evaluation.  Check an acute hepatitis panel.  Continue empiric IV Rocephin, IV Flagyl.  Consult with GI for further evaluation and management.  2.  Acute respiratory failure with hypoxia/??  Community-acquired pneumonia Patient denies any cough.  Patient however with nausea and vomiting and likely could have aspirated.  CT angiogram chest done for pulmonary embolus, bilateral lower lobe airspace opacities r > l which could reflect atelectasis or pneumonia.  Trace right pleural effusion.  SARS coronavirus 2 PCR negative.  Blood cultures pending.  Continue IV Rocephin, IV Flagyl, IV azithromycin.  Supportive care.  3.  Hypertension Patient currently n.p.o.  Was on Norvasc at home which we will continue to hold for now.  Will place on Lopressor 2.5 mg IV every 8 hours.  Follow.  4.  Dehydration Increase  IV fluids to 100 cc/h.  Follow.  5.  Hyperglycemia Patient with no prior history of diabetes.  Hemoglobin A1c 6.3.  Will need outpatient follow-up.  6.  Gastroesophageal reflux disease PPI.  7.  Anxiety disorder Elavil on hold.  We will hold Celexa and resume tomorrow after HIDA scan is done.  Follow.  8.  Hyperlipidemia Statin on hold.   DVT prophylaxis: Lovenox Code Status: Full Family Communication: Updated  patient.  No family at bedside. Disposition:   Status is: Inpatient    Dispo: The patient is from: Home              Anticipated d/c is to: Likely home              Anticipated d/c date is: 3 to 4 days              Patient currently being worked up for right upper quadrant pain, transaminitis.  Not stable for discharge.       Consultants:   None  Procedures:   CT angio 06/05/2020  Chest x-ray 06/05/2020    Antimicrobials:   IV Rocephin 06/05/2020>>>>  IV Flagyl 06/05/2020>>>>>  IV azithromycin 06/05/2020>>>>   Subjective: Patient laying in bed.  Currently n.p.o. awaiting HIDA scan to be done.  Denies any productive cough.  Denies any significant shortness of breath.  No chest pain.  Still with complaints of right upper quadrant pain.  States right upper quadrant pain associated with foods.  States current pain regimen helping with her pain.  Objective: Vitals:   06/05/20 1623 06/05/20 2016 06/06/20 0001 06/06/20 1039  BP: 137/72 (!) 162/78 (!) 158/78 139/72  Pulse: 87 98 (!) 105 82  Resp: 15 16 17    Temp: 98.3 F (36.8 C) 99.5 F (37.5 C) 98.6 F (37 C)   TempSrc: Oral Oral Oral   SpO2: 90% 90% 92%   Weight:      Height:        Intake/Output Summary (Last 24 hours) at 06/06/2020 1104 Last data filed at 06/05/2020 2022 Gross per 24 hour  Intake 1427.94 ml  Output 200 ml  Net 1227.94 ml   Filed Weights   06/05/20 0852  Weight: 90.2 kg    Examination:  General exam: Appears calm and comfortable  Respiratory system: Decreased breath sounds in the bases otherwise clear.  No wheezing.  No crackles.  No rhonchi.  Cardiovascular system: S1 & S2 heard, RRR. No JVD, murmurs, rubs, gallops or clicks. No pedal edema. Gastrointestinal system: Abdomen is nondistended, soft and nontender. No organomegaly or masses felt. Normal bowel sounds heard. Central nervous system: Alert and oriented. No focal neurological deficits. Extremities: Symmetric 5 x 5 power. Skin: No  rashes, lesions or ulcers Psychiatry: Judgement and insight appear normal. Mood & affect appropriate.     Data Reviewed: I have personally reviewed following labs and imaging studies  CBC: Recent Labs  Lab 06/02/20 0939 06/05/20 0901 06/06/20 0524  WBC 8.7 21.0* 14.1*  NEUTROABS 7.1 18.9*  --   HGB 14.9 13.5 11.7*  HCT 44.0 40.2 34.8*  MCV 94.6 95.9 96.9  PLT 150 206 257    Basic Metabolic Panel: Recent Labs  Lab 06/02/20 1159 06/05/20 0901 06/06/20 0524  NA 137 132* 134*  K 3.3* 3.7 3.7  CL 101 97* 102  CO2 24 23 23   GLUCOSE 155* 200* 136*  BUN 8 21 13   CREATININE 0.49 1.13* 0.58  CALCIUM 8.8* 9.6 8.2*    GFR: Estimated  Creatinine Clearance: 64.6 mL/min (by C-G formula based on SCr of 0.58 mg/dL).  Liver Function Tests: Recent Labs  Lab 06/02/20 1159 06/05/20 0901 06/06/20 0524  AST 76* 21 135*  ALT 234* 75* 134*  ALKPHOS 152* 103 223*  BILITOT 1.6* 1.2 2.8*  PROT 7.5 6.9 6.4*  ALBUMIN 3.5 3.4* 2.4*    CBG: No results for input(s): GLUCAP in the last 168 hours.   Recent Results (from the past 240 hour(s))  SARS Coronavirus 2 by RT PCR (hospital order, performed in [facility] hospital lab) Nasopharyngeal Nasopharyngeal Swab     Status: None   Collection Time: 06/02/20 10:02 AM   Specimen: Nasopharyngeal Swab  Result Value Ref Range Status   SARS Coronavirus 2 NEGATIVE NEGATIVE Final    Comment: (NOTE) SARS-CoV-2 target nucleic acids are NOT DETECTED.  The SARS-CoV-2 RNA is generally detectable in upper and lower respiratory specimens during the acute phase of infection. The lowest concentration of SARS-CoV-2 viral copies this assay can detect is 250 copies / mL. A negative result does not preclude SARS-CoV-2 infection and should not be used as the sole basis for treatment or other patient management decisions.  A negative result may occur with improper specimen collection / handling, submission of specimen other than nasopharyngeal swab,  presence of viral mutation(s) within the areas targeted by this assay, and inadequate number of viral copies (<250 copies / mL). A negative result must be combined with clinical observations, patient history, and epidemiological information.  Fact Sheet for Patients:   BoilerBrush.com.cy  Fact Sheet for Healthcare Providers: https://pope.com/  This test is not yet approved or  cleared by the [place] FDA and has been authorized for detection and/or diagnosis of SARS-CoV-2 by FDA under an Emergency Use Authorization (EUA).  This EUA will remain in effect (meaning this test can be used) for the duration of the COVID-19 declaration under Section 564(b)(1) of the Act, 21 U.S.C. section 360bbb-3(b)(1), unless the authorization is terminated or revoked sooner.  Performed at [facility], [address] [place], [place] [address]   SARS Coronavirus 2 by RT PCR (hospital order, performed in [facility] hospital lab) Nasopharyngeal Nasopharyngeal Swab     Status: None   Collection Time: 06/05/20 10:21 AM   Specimen: Nasopharyngeal Swab  Result Value Ref Range Status   SARS Coronavirus 2 NEGATIVE NEGATIVE Final    Comment: (NOTE) SARS-CoV-2 target nucleic acids are NOT DETECTED.  The SARS-CoV-2 RNA is generally detectable in upper and lower respiratory specimens during the acute phase of infection. The lowest concentration of SARS-CoV-2 viral copies this assay can detect is 250 copies / mL. A negative result does not preclude SARS-CoV-2 infection and should not be used as the sole basis for treatment or other patient management decisions.  A negative result may occur with improper specimen collection / handling, submission of specimen other than nasopharyngeal swab, presence of viral mutation(s) within the areas targeted by this assay, and inadequate number of viral copies (<250 copies / mL). A negative result must be  combined with clinical observations, patient history, and epidemiological information.  Fact Sheet for Patients:   BoilerBrush.com.cy  Fact Sheet for Healthcare Providers: https://pope.com/  This test is not yet approved or  cleared by the [place] FDA and has been authorized for detection and/or diagnosis of SARS-CoV-2 by FDA under an Emergency Use Authorization (EUA).  This EUA will remain in effect (meaning this test can be used) for the duration of the  COVID-19 declaration under Section 564(b)(1) of the Act, 21 U.S.C. section 360bbb-3(b)(1), unless the authorization is terminated or revoked sooner.  Performed at [facility], [address] [place], [place] [address]          Radiology Studies: CT Angio Chest PE W and/or Wo Contrast  Result Date: 06/05/2020 CLINICAL DATA:  Right upper quadrant pain EXAM: CT ANGIOGRAPHY CHEST WITH CONTRAST TECHNIQUE: Multidetector CT imaging of the chest was performed using the standard protocol during bolus administration of intravenous contrast. Multiplanar CT image reconstructions and MIPs were obtained to evaluate the vascular anatomy. CONTRAST:  OMNIPAQUE IOHEXOL 350 MG/ML SOLN COMPARISON:  None. FINDINGS: Cardiovascular: No filling defects in the pulmonary arteries to suggest pulmonary emboli. Aortic atherosclerosis and coronary artery disease. Heart is normal size. Aorta normal caliber. Mediastinum/Nodes: No mediastinal, hilar, or axillary adenopathy. Moderate-sized hiatal hernia. Thyroid unremarkable. Lungs/Pleura: Bilateral lower lobe airspace opacities, right greater than left could reflect atelectasis or infiltrate/pneumonia. Trace right pleural effusion. Upper Abdomen: Calcifications in the spleen compatible with old granulomatous disease. Several small calcified splenic artery aneurysms measuring up to 12 mm. No acute findings. Musculoskeletal: Chest wall soft tissues  are unremarkable. Mild compression fracture through the superior endplate of a lower thoracic vertebral body, likely T10. Review of the MIP images confirms the above findings. IMPRESSION: No evidence of pulmonary embolus. Bilateral lower lobe airspace opacities, right greater than left which could reflect atelectasis or pneumonia. Trace right pleural effusion. Coronary artery disease. 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 Moderate-sized hiatal hernia. Mild compression fracture through the superior endplate at T10, age indeterminate. Aortic Atherosclerosis (ICD10-I70.0). Electronically Signed   By: Charlett Nose M.D.   On: 06/05/2020 10:34   DG Chest Port 1 View  Result Date: 06/05/2020 CLINICAL DATA:  RIGHT upper quadrant pain EXAM: PORTABLE CHEST 1 VIEW COMPARISON:  June 02, 2020 FINDINGS: The cardiomediastinal silhouette is unchanged in contour.Elevation of the RIGHT hemidiaphragm. No pleural effusion. No pneumothorax. Increased RIGHT middle lobe linear opacity. Increased LEFT lower lung predominately linear opacity. Visualized abdomen is unremarkable. Degenerative changes of bilateral shoulders. IMPRESSION: Increased RIGHT middle lobe and LEFT lower lung predominately linear opacities, favored to represent atelectasis. Superimposed infection or aspiration remains in the differential. Electronically Signed   By: Meda Klinefelter MD   On: 06/05/2020 09:38        Scheduled Meds: . citalopram  40 mg Oral Daily  . enoxaparin (LOVENOX) injection  40 mg Subcutaneous Q24H  . metoprolol tartrate  2.5 mg Intravenous Q8H  . pantoprazole (PROTONIX) IV  40 mg Intravenous Daily   Continuous Infusions: . sodium chloride Stopped (06/05/20 1526)  . sodium chloride    . azithromycin    . cefTRIAXone (ROCEPHIN)  IV 2 g (06/06/20 1025)  . metronidazole 500 mg (06/06/20 0856)     LOS: 1 day    Time spent: 40 minutes    Ramiro Harvest, MD [facility]   To contact the attending provider between 7A-7P or the covering provider during after hours 7P-7A, please log into the web site [email or web address] and access using universal [place] password for that web site. If you do not have the password, please call the hospital operator.  06/06/2020, 11:04 AM

## 2020-06-07 ENCOUNTER — Encounter (HOSPITAL_COMMUNITY)
Admission: EM | Disposition: A | Discharge status: Home / Self Care | Payer: Self-pay | Source: Home / Self Care | Attending: Internal Medicine

## 2020-06-07 ENCOUNTER — Encounter (HOSPITAL_COMMUNITY): Payer: Self-pay | Admitting: Internal Medicine

## 2020-06-07 ENCOUNTER — Inpatient Hospital Stay (HOSPITAL_COMMUNITY): Payer: Medicare Other

## 2020-06-07 ENCOUNTER — Inpatient Hospital Stay (HOSPITAL_COMMUNITY): Payer: Medicare Other | Admitting: Anesthesiology

## 2020-06-07 DIAGNOSIS — K810 Acute cholecystitis: Principal | ICD-10-CM

## 2020-06-07 DIAGNOSIS — E876 Hypokalemia: Secondary | ICD-10-CM

## 2020-06-07 HISTORY — PX: CHOLECYSTECTOMY: SHX55

## 2020-06-07 LAB — HEPATIC FUNCTION PANEL
ALT: 107 U/L — ABNORMAL HIGH (ref 0–44)
AST: 64 U/L — ABNORMAL HIGH (ref 15–41)
Albumin: 2.3 g/dL — ABNORMAL LOW (ref 3.5–5.0)
Alkaline Phosphatase: 193 U/L — ABNORMAL HIGH (ref 38–126)
Bilirubin, Direct: 0.6 mg/dL — ABNORMAL HIGH (ref 0.0–0.2)
Indirect Bilirubin: 1.1 mg/dL — ABNORMAL HIGH (ref 0.3–0.9)
Total Bilirubin: 1.7 mg/dL — ABNORMAL HIGH (ref 0.3–1.2)
Total Protein: 5.8 g/dL — ABNORMAL LOW (ref 6.5–8.1)

## 2020-06-07 LAB — CBC WITH DIFFERENTIAL/PLATELET
Abs Immature Granulocytes: 0.08 10*3/uL — ABNORMAL HIGH (ref 0.00–0.07)
Basophils Absolute: 0 10*3/uL (ref 0.0–0.1)
Basophils Relative: 0 %
Eosinophils Absolute: 0.1 10*3/uL (ref 0.0–0.5)
Eosinophils Relative: 0 %
HCT: 35 % — ABNORMAL LOW (ref 36.0–46.0)
Hemoglobin: 11.6 g/dL — ABNORMAL LOW (ref 12.0–15.0)
Immature Granulocytes: 1 %
Lymphocytes Relative: 8 %
Lymphs Abs: 0.9 10*3/uL (ref 0.7–4.0)
MCH: 32.7 pg (ref 26.0–34.0)
MCHC: 33.1 g/dL (ref 30.0–36.0)
MCV: 98.6 fL (ref 80.0–100.0)
Monocytes Absolute: 0.7 10*3/uL (ref 0.1–1.0)
Monocytes Relative: 6 %
Neutro Abs: 9.4 10*3/uL — ABNORMAL HIGH (ref 1.7–7.7)
Neutrophils Relative %: 85 %
Platelets: 315 10*3/uL (ref 150–400)
RBC: 3.55 MIL/uL — ABNORMAL LOW (ref 3.87–5.11)
RDW: 13.2 % (ref 11.5–15.5)
WBC: 11.2 10*3/uL — ABNORMAL HIGH (ref 4.0–10.5)
nRBC: 0 % (ref 0.0–0.2)

## 2020-06-07 LAB — BASIC METABOLIC PANEL
Anion gap: 11 (ref 5–15)
BUN: 10 mg/dL (ref 8–23)
CO2: 24 mmol/L (ref 22–32)
Calcium: 8.2 mg/dL — ABNORMAL LOW (ref 8.9–10.3)
Chloride: 101 mmol/L (ref 98–111)
Creatinine, Ser: 0.53 mg/dL (ref 0.44–1.00)
GFR calc Af Amer: >60 mL/min (ref >60)
GFR calc non Af Amer: >60 mL/min (ref >60)
Glucose, Bld: 89 mg/dL (ref 70–99)
Potassium: 3.3 mmol/L — ABNORMAL LOW (ref 3.5–5.1)
Sodium: 136 mmol/L (ref 135–145)

## 2020-06-07 LAB — MAGNESIUM: Magnesium: 2.1 mg/dL (ref 1.7–2.4)

## 2020-06-07 LAB — FERRITIN: Ferritin: 271 ng/mL (ref 11–307)

## 2020-06-07 LAB — IRON: Iron: 40 ug/dL (ref 28–170)

## 2020-06-07 SURGERY — LAPAROSCOPIC CHOLECYSTECTOMY WITH INTRAOPERATIVE CHOLANGIOGRAM
Anesthesia: General | Site: Abdomen

## 2020-06-07 MED ORDER — ACETAMINOPHEN 325 MG PO TABS
650.0000 mg | ORAL_TABLET | Freq: Four times a day (QID) | ORAL | Status: DC | PRN
  Administered 2020-06-07 – 2020-06-09 (×2): 650 mg via ORAL
  Filled 2020-06-07: qty 2

## 2020-06-07 MED ORDER — FENTANYL CITRATE (PF) 250 MCG/5ML IJ SOLN
INTRAMUSCULAR | Status: AC
  Filled 2020-06-07: qty 5

## 2020-06-07 MED ORDER — FENTANYL CITRATE (PF) 100 MCG/2ML IJ SOLN
INTRAMUSCULAR | Status: DC | PRN
  Administered 2020-06-07 (×5): 50 ug via INTRAVENOUS

## 2020-06-07 MED ORDER — ACETAMINOPHEN 650 MG RE SUPP: 650.0000 mg | Freq: Four times a day (QID) | RECTAL | Status: DC | PRN

## 2020-06-07 MED ORDER — OXYCODONE HCL 5 MG PO TABS
5.0000 mg | ORAL_TABLET | ORAL | Status: DC | PRN
  Administered 2020-06-08 – 2020-06-10 (×3): 10 mg via ORAL
  Filled 2020-06-07 (×4): qty 2

## 2020-06-07 MED ORDER — SUGAMMADEX SODIUM 200 MG/2ML IV SOLN
INTRAVENOUS | Status: DC | PRN
  Administered 2020-06-07: 200 mg via INTRAVENOUS

## 2020-06-07 MED ORDER — DEXAMETHASONE SODIUM PHOSPHATE 10 MG/ML IJ SOLN
INTRAMUSCULAR | Status: DC | PRN
  Administered 2020-06-07: 10 mg via INTRAVENOUS

## 2020-06-07 MED ORDER — LIDOCAINE 2% (20 MG/ML) 5 ML SYRINGE
INTRAMUSCULAR | Status: DC | PRN
  Administered 2020-06-07: 60 mg via INTRAVENOUS

## 2020-06-07 MED ORDER — SODIUM CHLORIDE 0.45 % IV SOLN: INTRAVENOUS | Status: DC

## 2020-06-07 MED ORDER — LOSARTAN POTASSIUM 50 MG PO TABS
50.0000 mg | ORAL_TABLET | Freq: Every day | ORAL | Status: DC
  Administered 2020-06-08 – 2020-06-11 (×4): 50 mg via ORAL
  Filled 2020-06-07 (×4): qty 1

## 2020-06-07 MED ORDER — 0.9 % SODIUM CHLORIDE (POUR BTL) OPTIME
TOPICAL | Status: DC | PRN
  Administered 2020-06-07: 1000 mL

## 2020-06-07 MED ORDER — IOHEXOL 300 MG/ML  SOLN
INTRAMUSCULAR | Status: DC | PRN
  Administered 2020-06-07: 5 mL

## 2020-06-07 MED ORDER — HYDROMORPHONE HCL 1 MG/ML IJ SOLN
0.2500 mg | INTRAMUSCULAR | Status: DC | PRN
  Administered 2020-06-07 (×4): 0.5 mg via INTRAVENOUS

## 2020-06-07 MED ORDER — HYDROMORPHONE HCL 1 MG/ML IJ SOLN
INTRAMUSCULAR | Status: AC
  Filled 2020-06-07: qty 1

## 2020-06-07 MED ORDER — LABETALOL HCL 5 MG/ML IV SOLN
INTRAVENOUS | Status: DC | PRN
  Administered 2020-06-07: 5 mg via INTRAVENOUS

## 2020-06-07 MED ORDER — ROCURONIUM BROMIDE 10 MG/ML (PF) SYRINGE
PREFILLED_SYRINGE | INTRAVENOUS | Status: DC | PRN
  Administered 2020-06-07: 50 mg via INTRAVENOUS
  Administered 2020-06-07: 10 mg via INTRAVENOUS

## 2020-06-07 MED ORDER — ONDANSETRON HCL 4 MG/2ML IJ SOLN
INTRAMUSCULAR | Status: DC | PRN
  Administered 2020-06-07: 4 mg via INTRAVENOUS

## 2020-06-07 MED ORDER — HYDROMORPHONE HCL 1 MG/ML IJ SOLN: 0.5000 mg | INTRAMUSCULAR | Status: DC | PRN

## 2020-06-07 MED ORDER — POTASSIUM CHLORIDE CRYS ER 20 MEQ PO TBCR
40.0000 meq | EXTENDED_RELEASE_TABLET | Freq: Once | ORAL | Status: AC
  Administered 2020-06-07: 40 meq via ORAL
  Filled 2020-06-07: qty 2

## 2020-06-07 MED ORDER — PROPOFOL 10 MG/ML IV BOLUS
INTRAVENOUS | Status: DC | PRN
  Administered 2020-06-07: 40 mg via INTRAVENOUS
  Administered 2020-06-07: 120 mg via INTRAVENOUS

## 2020-06-07 MED ORDER — PROPOFOL 10 MG/ML IV BOLUS
INTRAVENOUS | Status: AC
  Filled 2020-06-07: qty 20

## 2020-06-07 MED ORDER — ONDANSETRON HCL 4 MG/2ML IJ SOLN
4.0000 mg | Freq: Four times a day (QID) | INTRAMUSCULAR | Status: DC | PRN
  Administered 2020-06-10: 4 mg via INTRAVENOUS
  Filled 2020-06-07: qty 2

## 2020-06-07 MED ORDER — BUPIVACAINE-EPINEPHRINE (PF) 0.5% -1:200000 IJ SOLN
INTRAMUSCULAR | Status: AC
  Filled 2020-06-07: qty 30

## 2020-06-07 MED ORDER — PROMETHAZINE HCL 25 MG/ML IJ SOLN: 6.2500 mg | INTRAMUSCULAR | Status: DC | PRN

## 2020-06-07 MED ORDER — TRAMADOL HCL 50 MG PO TABS
50.0000 mg | ORAL_TABLET | Freq: Four times a day (QID) | ORAL | Status: DC | PRN
  Administered 2020-06-07 – 2020-06-11 (×6): 50 mg via ORAL
  Filled 2020-06-07 (×6): qty 1

## 2020-06-07 MED ORDER — FAMOTIDINE 20 MG PO TABS
40.0000 mg | ORAL_TABLET | Freq: Every day | ORAL | Status: DC | PRN
  Administered 2020-06-07: 40 mg via ORAL
  Filled 2020-06-07: qty 2

## 2020-06-07 MED ORDER — CITALOPRAM HYDROBROMIDE 20 MG PO TABS
20.0000 mg | ORAL_TABLET | Freq: Every day | ORAL | Status: DC
  Administered 2020-06-08 – 2020-06-12 (×5): 20 mg via ORAL
  Filled 2020-06-07 (×5): qty 1

## 2020-06-07 MED ORDER — LACTATED RINGERS IV SOLN: INTRAVENOUS | Status: DC | PRN

## 2020-06-07 MED ORDER — LACTATED RINGERS IR SOLN
Status: DC | PRN
  Administered 2020-06-07: 1000 mL

## 2020-06-07 MED ORDER — AMISULPRIDE (ANTIEMETIC) 5 MG/2ML IV SOLN: 10.0000 mg | Freq: Once | INTRAVENOUS | Status: DC | PRN

## 2020-06-07 MED ORDER — CITALOPRAM HYDROBROMIDE 20 MG PO TABS: 20.0000 mg | ORAL_TABLET | Freq: Every day | ORAL | Status: DC

## 2020-06-07 MED ORDER — BUPIVACAINE-EPINEPHRINE 0.5% -1:200000 IJ SOLN
INTRAMUSCULAR | Status: DC | PRN
  Administered 2020-06-07: 20 mL

## 2020-06-07 MED ORDER — AMITRIPTYLINE HCL 25 MG PO TABS
25.0000 mg | ORAL_TABLET | Freq: Every evening | ORAL | Status: DC | PRN
  Administered 2020-06-07: 25 mg via ORAL
  Filled 2020-06-07: qty 1

## 2020-06-07 MED ORDER — LABETALOL HCL 5 MG/ML IV SOLN
INTRAVENOUS | Status: AC
  Filled 2020-06-07: qty 8

## 2020-06-07 MED ORDER — HYDROMORPHONE HCL 1 MG/ML IJ SOLN
1.0000 mg | INTRAMUSCULAR | Status: DC | PRN
  Administered 2020-06-07 – 2020-06-09 (×8): 1 mg via INTRAVENOUS
  Filled 2020-06-07 (×8): qty 1

## 2020-06-07 MED ORDER — OXYCODONE HCL 5 MG/5ML PO SOLN: 5.0000 mg | Freq: Once | ORAL | Status: DC | PRN

## 2020-06-07 MED ORDER — ONDANSETRON 4 MG PO TBDP: 4.0000 mg | ORAL_TABLET | Freq: Four times a day (QID) | ORAL | Status: DC | PRN

## 2020-06-07 MED ORDER — OXYCODONE HCL 5 MG PO TABS: 5.0000 mg | ORAL_TABLET | Freq: Once | ORAL | Status: DC | PRN

## 2020-06-07 SURGICAL SUPPLY — 41 items
APPLIER CLIP ROT 10 11.4 M/L (STAPLE) ×3
CABLE HIGH FREQUENCY MONO STRZ (ELECTRODE) ×6 IMPLANT
CHLORAPREP W/TINT 26 (MISCELLANEOUS) ×3 IMPLANT
CLIP APPLIE ROT 10 11.4 M/L (STAPLE) ×1 IMPLANT
CLOSURE WOUND 1/2 X4 (GAUZE/BANDAGES/DRESSINGS)
COVER MAYO STAND STRL (DRAPES) ×3 IMPLANT
COVER SURGICAL LIGHT HANDLE (MISCELLANEOUS) ×3 IMPLANT
COVER WAND RF STERILE (DRAPES) IMPLANT
DECANTER SPIKE VIAL GLASS SM (MISCELLANEOUS) ×3 IMPLANT
DERMABOND ADVANCED (GAUZE/BANDAGES/DRESSINGS) ×2
DERMABOND ADVANCED .7 DNX12 (GAUZE/BANDAGES/DRESSINGS) ×1 IMPLANT
DRAIN CHANNEL 19F RND (DRAIN) ×3 IMPLANT
DRAPE C-ARM 42X120 X-RAY (DRAPES) ×3 IMPLANT
ELECT REM PT RETURN 15FT ADLT (MISCELLANEOUS) ×3 IMPLANT
EVACUATOR SILICONE 100CC (DRAIN) ×3 IMPLANT
GAUZE SPONGE 2X2 8PLY STRL LF (GAUZE/BANDAGES/DRESSINGS) IMPLANT
GLOVE SURG ORTHO 8.0 STRL STRW (GLOVE) ×3 IMPLANT
GOWN STRL REUS W/TWL XL LVL3 (GOWN DISPOSABLE) ×6 IMPLANT
HEMOSTAT SNOW SURGICEL 2X4 (HEMOSTASIS) ×6 IMPLANT
HEMOSTAT SURGICEL 4X8 (HEMOSTASIS) IMPLANT
KIT BASIN OR (CUSTOM PROCEDURE TRAY) ×3 IMPLANT
KIT TURNOVER KIT A (KITS) ×3 IMPLANT
PENCIL SMOKE EVACUATOR (MISCELLANEOUS) IMPLANT
POUCH SPECIMEN RETRIEVAL 10MM (ENDOMECHANICALS) ×3 IMPLANT
SCISSORS LAP 5X35 DISP (ENDOMECHANICALS) ×3 IMPLANT
SET CHOLANGIOGRAPH MIX (MISCELLANEOUS) ×3 IMPLANT
SET IRRIG TUBING LAPAROSCOPIC (IRRIGATION / IRRIGATOR) ×6 IMPLANT
SET TUBE SMOKE EVAC HIGH FLOW (TUBING) IMPLANT
SLEEVE XCEL OPT CAN 5 100 (ENDOMECHANICALS) ×3 IMPLANT
SPONGE DRAIN TRACH 4X4 STRL 2S (GAUZE/BANDAGES/DRESSINGS) ×3 IMPLANT
SPONGE GAUZE 2X2 STER 10/PKG (GAUZE/BANDAGES/DRESSINGS)
STRIP CLOSURE SKIN 1/2X4 (GAUZE/BANDAGES/DRESSINGS) IMPLANT
SUT ETHILON 3 0 PS 1 (SUTURE) ×3 IMPLANT
SUT MNCRL AB 4-0 PS2 18 (SUTURE) ×6 IMPLANT
TAPE CLOTH SURG 4X10 WHT LF (GAUZE/BANDAGES/DRESSINGS) ×3 IMPLANT
TOWEL OR 17X26 10 PK STRL BLUE (TOWEL DISPOSABLE) ×3 IMPLANT
TOWEL OR NON WOVEN STRL DISP B (DISPOSABLE) ×3 IMPLANT
TRAY LAPAROSCOPIC (CUSTOM PROCEDURE TRAY) ×3 IMPLANT
TROCAR BLADELESS OPT 5 100 (ENDOMECHANICALS) ×3 IMPLANT
TROCAR XCEL BLUNT TIP 100MML (ENDOMECHANICALS) ×3 IMPLANT
TROCAR XCEL NON-BLD 11X100MML (ENDOMECHANICALS) ×3 IMPLANT

## 2020-06-07 NOTE — Progress Notes (Signed)
PROGRESS NOTE    Margaret Cross  FOY:[id number] DOB: 11/04/1940 DOA: 06/05/2020 PCP: Harold Barban, MD    Chief Complaint  Patient presents with  . Abdominal Pain    Brief Narrative:  HPI per [name] is a 79 y.o. female with a past medical history of anxiety disorder, essential hypertension who was in her usual state of health till about 2 weeks ago when she noticed that she had lost her appetite.  She felt fatigued.   She also had bouts of nausea and vomiting about a week ago. And then about 3 days prior to this admission patient started developing abdominal pain in the right upper quadrant along with some nausea.    She went to the emergency department and was evaluated and was noted to have abnormal LFTs.  A CT abdomen raised concern for inflammation in the gallbladder.  Abdominal ultrasound however did not show any acute cholecystitis.  Patient was discharged home.  She came back to the emergency department today due to persistent symptoms.  She has also developed some shortness of breath. She has chronic acid reflux and has a chronic cough as a result of the same. Her cough really has not changed much.  Evaluation done today showed improvement in the LFTs.  A CT angiogram was done which did not show any PE but raised concern for possible infectious process in the lung.  It was felt that perhaps this was causing her abdominal symptoms.  Patient currently denies any chest pain.  She continues to have pain in the right upper abdomen which she rates at 6 out of 10 in intensity without any radiation.  No precipitating aggravating or relieving factors.  She did have fever last week as well.  She has noticed that she tends to get a little short of breath when she talks a lot.  Denies any diarrhea.  See above for ED course.  Assessment & Plan:   Principal Problem:   Acute emphysematous cholecystitis Active Problems:   Transaminitis   Acute respiratory failure with  hypoxia (HCC)   CAP (community acquired pneumonia)   RUQ pain   Essential hypertension   Anxiety   Hyperglycemia   Hypokalemia  1 acute emphysematous cholecystitis  Concern for gallbladder disease versus biliary disease initially on admission as patient had presented with right upper quadrant abdominal pain with a transaminitis..  Patient with a prior 2-week history of nausea decreased appetite, emesis, right upper quadrant abdominal pain noted to have elevated transaminitis.  CT abdomen done 06/02/2020 on presentation to the ED, did suggest some inflammation in the gallbladder however right upper quadrant ultrasound done a few days ago was negative for acute cholecystitis.  Patient with ongoing right upper quadrant pain.  Patient also noted to have a low-grade fever.  Patient on admission with a leukocytosis, worsening transaminitis.  Patient currently n.p.o. HIDA scan ordered however unable to be done on 06/06/2020 due to lack of tracer.  Acute hepatitis panel done negative.  GI was consulted and patient seen in consultation by Dr. Christella Hartigan who recommended/ordered MRCP done the evening of 06/06/2020, which was concerning for acute emphysematous cholecystitis.  Consulted and patient seen in consultation by Dr.Gerkin who assessed the patient this morning, reviewed labs, chart, studies and he was in agreement that patient does require cholecystectomy.  Patient likely to have cholecystectomy later on today per general surgery.  Continue current antibiotic regimen of IV Rocephin, IV Flagyl.  GI following and appreciate input and recommendations.  Per general surgery.   2.  Acute respiratory failure with hypoxia/??  Community-acquired pneumonia Patient denies any cough.  Patient however with nausea and vomiting and likely could have aspirated.  CT angiogram chest done for pulmonary embolus, bilateral lower lobe airspace opacities R > L which could reflect atelectasis or pneumonia.  Trace right pleural effusion.   MRCP with small bilateral effusions noted R > L.  SARS coronavirus 2 PCR negative.  Blood cultures pending.  IV fluids have been decreased to 50 cc/h as patient currently n.p.o. likely to go to the OR.  Continue IV Rocephin, IV Flagyl, IV azithromycin.  Supportive care.  3.  Hypertension Patient currently n.p.o.  Was on Norvasc at home which we will continue to hold for now.  Continue current dose of IV Lopressor 2.5 mg every 8 hours.  May uptitrate as needed for better blood pressure control.  Follow.    4.  Dehydration IV fluids have been decreased to 50 cc/h.  Follow.  5.  Hyperglycemia Patient with no prior history of diabetes.  Hemoglobin A1c 6.3.  Blood glucose level on basic metabolic profile this morning at 89.  Will need outpatient follow-up.  6.  Gastroesophageal reflux disease Continue IV PPI.  Transition to oral PPI when patient has been advanced to oral diet postoperatively.  Follow for now.  7.  Anxiety disorder Continue to hold Elavil.  Will decrease dose of Celexa to 20 mg daily and resume tomorrow.    8.  Hyperlipidemia Continue to hold statin.  9.  Hypokalemia K. Dur 40 mEq p.o. x1.   DVT prophylaxis: SCDs.  Likely to go to the OR today. Code Status: Full Family Communication: Updated patient.  No family at bedside. Disposition:   Status is: Inpatient    Dispo: The patient is from: Home              Anticipated d/c is to: Likely home              Anticipated d/c date is: 3 to 4 days              Patient with acute emphysematous cholecystitis likely to go to the OR today for cholecystectomy per general surgery.  Not stable for discharge.        Consultants:   Gastroenterology: Dr. Christella Hartigan 06/06/2020  General surgery: Dr.Gerkin 06/07/2020  Procedures:   CT angio 06/05/2020  Chest x-ray 06/05/2020  MRCP 06/06/2020  Antimicrobials:   IV Rocephin 06/05/2020>>>>  IV Flagyl 06/05/2020>>>>>  IV azithromycin 06/05/2020>>>>   Subjective: Patient laying in bed.   States she just does not feel too well today.  Complaining of feeling hot.  Complains of right upper quadrant pain which is currently being managed on current pain regimen.  No chest pain.  No significant shortness of breath however on nasal cannula.  States she saw surgeon early this morning around 7 AM.   Objective: Vitals:   06/06/20 1236 06/06/20 1838 06/06/20 2145 06/07/20 0552  BP: 140/75 (!) 160/77 (!) 160/77 (!) 162/86  Pulse: 78 87 84 85  Resp: 18  18 16   Temp: 98.2 F (36.8 C)  98.2 F (36.8 C) 98 F (36.7 C)  TempSrc: Oral  Oral Oral  SpO2: 93%  91% 90%  Weight:      Height:        Intake/Output Summary (Last 24 hours) at 06/07/2020 0911 Last data filed at 06/06/2020 1900 Gross per 24 hour  Intake 343.61 ml  Output --  Net 343.61 ml   Filed Weights   06/05/20 0852  Weight: 90.2 kg    Examination:  General exam: Appears calm and comfortable  Respiratory system: Some decreased breath sounds in the bases with some bibasilar crackles.  No wheezing.  Normal respiratory effort.  Speaking in full sentences.  Cardiovascular system: Regular rate and rhythm no murmurs rubs or gallops.  No JVD.  No lower extremity edema.  Gastrointestinal system: Abdomen is soft, nondistended, tender to palpation right upper quadrant greater than epigastrium.  Positive bowel sounds.  No rebound.  No guarding.  Central nervous system: Alert and oriented. No focal neurological deficits. Extremities: Symmetric 5 x 5 power. Skin: No rashes, lesions or ulcers Psychiatry: Judgement and insight appear normal. Mood & affect appropriate.     Data Reviewed: I have personally reviewed following labs and imaging studies  CBC: Recent Labs  Lab 06/02/20 0939 06/05/20 0901 06/06/20 0524 06/07/20 0603  WBC 8.7 21.0* 14.1* 11.2*  NEUTROABS 7.1 18.9*  --  9.4*  HGB 14.9 13.5 11.7* 11.6*  HCT 44.0 40.2 34.8* 35.0*  MCV 94.6 95.9 96.9 98.6  PLT 150 206 257 315    Basic Metabolic Panel: Recent  Labs  Lab 06/02/20 1159 06/05/20 0901 06/06/20 0524 06/07/20 0603  NA 137 132* 134* 136  K 3.3* 3.7 3.7 3.3*  CL 101 97* 102 101  CO2 GLUCOSE 155* 200* 136* 89  BUN CREATININE 0.49 1.13* 0.58 0.53  CALCIUM 8.8* 9.6 8.2* 8.2*  MG  --   --   --  2.1    GFR: Estimated Creatinine Clearance: 64.6 mL/min (by C-G formula based on SCr of 0.53 mg/dL).  Liver Function Tests: Recent Labs  Lab 06/02/20 1159 06/05/20 0901 06/06/20 0524 06/07/20 0603  AST 76* 21 135* 64*  ALT 234* 75* 134* 107*  ALKPHOS 152* 103 223* 193*  BILITOT 1.6* 1.2 2.8* 1.7*  PROT 7.5 6.9 6.4* 5.8*  ALBUMIN 3.5 3.4* 2.4* 2.3*    CBG: No results for input(s): GLUCAP in the last 168 hours.   Recent Results (from the past 240 hour(s))  SARS Coronavirus 2 by RT PCR (hospital order, performed in [facility] hospital lab) Nasopharyngeal Nasopharyngeal Swab     Status: None   Collection Time: 06/02/20 10:02 AM   Specimen: Nasopharyngeal Swab  Result Value Ref Range Status   SARS Coronavirus 2 NEGATIVE NEGATIVE Final    Comment: (NOTE) SARS-CoV-2 target nucleic acids are NOT DETECTED.  The SARS-CoV-2 RNA is generally detectable in upper and lower respiratory specimens during the acute phase of infection. The lowest concentration of SARS-CoV-2 viral copies this assay can detect is 250 copies / mL. A negative result does not preclude SARS-CoV-2 infection and should not be used as the sole basis for treatment or other patient management decisions.  A negative result may occur with improper specimen collection / handling, submission of specimen other than nasopharyngeal swab, presence of viral mutation(s) within the areas targeted by this assay, and inadequate number of viral copies (<250 copies / mL). A negative result must be combined with clinical observations, patient history, and epidemiological information.  Fact Sheet for Patients:    BoilerBrush.com.cy  Fact Sheet for Healthcare Providers: https://pope.com/  This test is not yet approved or  cleared by the [place] FDA and has been authorized for detection and/or diagnosis of SARS-CoV-2 by FDA under an Emergency Use Authorization (EUA).  This EUA will remain in  effect (meaning this test can be used) for the duration of the COVID-19 declaration under Section 564(b)(1) of the Act, 21 U.S.C. section 360bbb-3(b)(1), unless the authorization is terminated or revoked sooner.  Performed at [facility], [address] [place], [place] [address]   Culture, blood (routine x 2)     Status: None (Preliminary result)   Collection Time: 06/05/20 10:00 AM   Specimen: BLOOD  Result Value Ref Range Status   Specimen Description   Final    BLOOD LEFT ANTECUBITAL Performed at [facility], [address] [place], [place] [address]    Special Requests   Final    BOTTLES DRAWN AEROBIC AND ANAEROBIC Blood Culture adequate volume Performed at [facility], [address] [place], [place] [address]    Culture   Final    NO GROWTH 1 DAY Performed at [facility], [address], [place], [place] [address]    Report Status PENDING  Incomplete  SARS Coronavirus 2 by RT PCR (hospital order, performed in [facility] hospital lab) Nasopharyngeal Nasopharyngeal Swab     Status: None   Collection Time: 06/05/20 10:21 AM   Specimen: Nasopharyngeal Swab  Result Value Ref Range Status   SARS Coronavirus 2 NEGATIVE NEGATIVE Final    Comment: (NOTE) SARS-CoV-2 target nucleic acids are NOT DETECTED.  The SARS-CoV-2 RNA is generally detectable in upper and lower respiratory specimens during the acute phase of infection. The lowest concentration of SARS-CoV-2 viral copies this assay can detect is 250 copies / mL. A negative result does not preclude SARS-CoV-2 infection and should not be used as  the sole basis for treatment or other patient management decisions.  A negative result may occur with improper specimen collection / handling, submission of specimen other than nasopharyngeal swab, presence of viral mutation(s) within the areas targeted by this assay, and inadequate number of viral copies (<250 copies / mL). A negative result must be combined with clinical observations, patient history, and epidemiological information.  Fact Sheet for Patients:   BoilerBrush.com.cy  Fact Sheet for Healthcare Providers: https://pope.com/  This test is not yet approved or  cleared by the [place] FDA and has been authorized for detection and/or diagnosis of SARS-CoV-2 by FDA under an Emergency Use Authorization (EUA).  This EUA will remain in effect (meaning this test can be used) for the duration of the COVID-19 declaration under Section 564(b)(1) of the Act, 21 U.S.C. section 360bbb-3(b)(1), unless the authorization is terminated or revoked sooner.  Performed at [facility], [address] [place], [place] [address]   Culture, blood (routine x 2)     Status: None (Preliminary result)   Collection Time: 06/05/20 10:30 AM   Specimen: BLOOD  Result Value Ref Range Status   Specimen Description   Final    BLOOD BLOOD LEFT HAND Performed at [facility], [address] [place], [place] [address]    Special Requests   Final    BOTTLES DRAWN AEROBIC AND ANAEROBIC Blood Culture adequate volume Performed at [facility], [address] [place], [place] [address]    Culture   Final    NO GROWTH 1 DAY Performed at [facility], [address] [place], [place] [address]    Report Status PENDING  Incomplete         Radiology Studies: CT Angio Chest PE W and/or Wo Contrast  Result Date: 06/05/2020 CLINICAL DATA:  Right upper quadrant pain EXAM: CT ANGIOGRAPHY CHEST WITH CONTRAST  TECHNIQUE: Multidetector CT imaging of the chest was performed using the standard protocol during bolus administration of intravenous contrast. Multiplanar CT image reconstructions and MIPs were obtained to evaluate the vascular anatomy. CONTRAST:  100mL OMNIPAQUE IOHEXOL 350 MG/ML SOLN COMPARISON:  None. FINDINGS: Cardiovascular: No filling defects in the pulmonary arteries to suggest pulmonary emboli. Aortic atherosclerosis and coronary artery disease. Heart is normal size. Aorta normal caliber. Mediastinum/Nodes: No mediastinal, hilar, or axillary adenopathy. Moderate-sized hiatal hernia. Thyroid unremarkable. Lungs/Pleura: Bilateral lower lobe airspace opacities, right greater than left could reflect atelectasis or infiltrate/pneumonia. Trace right pleural effusion. Upper Abdomen: Calcifications in the spleen compatible with old granulomatous disease. Several small calcified splenic artery aneurysms measuring up to 12 mm. No acute findings. Musculoskeletal: Chest wall soft tissues are unremarkable. Mild compression fracture through the superior endplate of a lower thoracic vertebral body, likely T10. Review of the MIP images confirms the above findings. IMPRESSION: No evidence of pulmonary embolus. Bilateral lower lobe airspace opacities, right greater than left which could reflect atelectasis or pneumonia. Trace right pleural effusion. Coronary artery disease. 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 Moderate-sized hiatal hernia. Mild compression fracture through the superior endplate at T10, age indeterminate. Aortic Atherosclerosis (ICD10-I70.0). Electronically Signed   By: Charlett NoseKevin  Dover M.D.   On: 06/05/2020 10:34   MR 3D Recon At Scanner  Addendum Date: 06/06/2020   ADDENDUM REPORT: 06/06/2020 21:31 ADDENDUM: These results were called by telephone on 06/06/2020 at 9:31 pm to provider Dr Toniann FailKakrakandy, who verbally acknowledged these results.  Electronically Signed   By: Kreg ShropshirePrice  DeHay M.D.   On: 06/06/2020 21:31   Result Date: 06/06/2020 CLINICAL DATA:  Right upper quadrant pain, nondiagnostic ultrasound, elevated LFTs EXAM: MRI ABDOMEN WITHOUT AND WITH CONTRAST (INCLUDING MRCP) TECHNIQUE: Multiplanar multisequence MR imaging of the abdomen was performed both before and after the administration of intravenous contrast. Heavily T2-weighted images of the biliary and pancreatic ducts were obtained, and three-dimensional MRCP images were rendered by post processing. CONTRAST:  7.645mL GADAVIST GADOBUTROL 1 MMOL/ML IV SOLN COMPARISON:  CT 06/02/2020, CT angio chest 06/05/2020 FINDINGS: Lower chest: There are small bilateral effusions, right slightly greater than left with adjacent areas of atelectatic lung which appear fairly uniform in signal intensity though underlying infection is not fully excluded. Cardiac size upper limits normal Hepatobiliary: No focal liver lesions are evident. The liver surface is smooth. Normal intrinsic liver signal without significant dropout on out of phase imaging to suggest fatty infiltration. There is however some mild hyperemic enhancement adjacent the gallbladder fossa which is likely reactive to the gallbladder inflammation. Additional mildly increased T2 signal about the biliary tree likely reflecting some periportal edema or inflammation. The gallbladder itself is markedly distended with a mixture of tumefactive sludge and more normal bile which is best seen on the source images and does extend into the normally tapering distal bile duct. There is diffuse gallbladder wall thickening and significant enhancement as well as bubbly foci T2 signal dropout towards the gallbladder fundus and within the gallbladder fossa concerning for emphysematous cholecystitis. Extensive surrounding T2 hyperintense inflammatory changes are present. Pancreas: Mild pancreatic atrophy. No visible enhancing or hypoenhancing lesions within the  pancreas. No pancreatic ductal dilatation. Spleen: Normal in size. No concerning splenic lesions. Foci of signal dropout towards the splenic hilum compatible with calcifications seen on comparison is. Including a larger focus  likely reflecting the suspected splenic artery aneurysm. Adrenals/Urinary Tract: Mild thickening of the left adrenal gland without discernible worrisome nodular mass compatible with adrenal hyperplasia. Mild bilateral perinephric stranding, nonspecific. Bilateral extrarenal pelves as well as several parapelvic renal cysts. No concerning renal lesions. Stomach/Bowel: Some likely reactive edematous changes about the hepatic flexure which closely abuts the distended tip of the gallbladder. Vascular/Lymphatic: Calcifications towards the splenic hilum compatible with splenic artery aneurysm seen on comparison CT. No other major vascular abnormalities. No worrisome adenopathy. Other: Inflammatory changes about the gallbladder and right upper quadrant. Mild body wall edema. Emphysematous changes appear confined to the bladder wall without evidence of free air. Musculoskeletal: No concerning marrow signal abnormalities or abnormal cord signal. Degenerative changes are present throughout the spine without convincing acute osseous injury. These include some Modic type endplate changes multiple levels of spine and anterior thoracic disc spaces. IMPRESSION: 1. Gallbladder demonstrating features of acute emphysematous cholecystitis and containing a large volume of tumefactive sludge extending into the proximal cystic duct but without distal common bile duct stone. Extensive surrounding inflammatory changes and hyperemic hyperenhancement of the adjacent liver parenchyma along the gallbladder fossa. Some mild adjacent inflammatory changes upon the hepatic flexure as well. 2. There is diffusely increased T2 signal about the biliary tree which could reflect some periportal edema or inflammation/cholangitis.  Correlate with clinical findings and laboratory values. 3. Small bilateral effusions, right slightly greater than left with adjacent areas of atelectatic lung. Underlying infection is not fully excluded. Currently attempting to contact the ordering provider with a critical value result. Addendum will be submitted upon case discussion. Electronically Signed: By: Kreg Shropshire M.D. On: 06/06/2020 21:20   DG Chest Port 1 View  Result Date: 06/05/2020 CLINICAL DATA:  RIGHT upper quadrant pain EXAM: PORTABLE CHEST 1 VIEW COMPARISON:  June 02, 2020 FINDINGS: The cardiomediastinal silhouette is unchanged in contour.Elevation of the RIGHT hemidiaphragm. No pleural effusion. No pneumothorax. Increased RIGHT middle lobe linear opacity. Increased LEFT lower lung predominately linear opacity. Visualized abdomen is unremarkable. Degenerative changes of bilateral shoulders. IMPRESSION: Increased RIGHT middle lobe and LEFT lower lung predominately linear opacities, favored to represent atelectasis. Superimposed infection or aspiration remains in the differential. Electronically Signed   By: Meda Klinefelter MD   On: 06/05/2020 09:38   MR ABDOMEN MRCP W WO CONTAST  Addendum Date: 06/06/2020   ADDENDUM REPORT: 06/06/2020 21:31 ADDENDUM: These results were called by telephone on 06/06/2020 at 9:31 pm to provider Dr Toniann Fail, who verbally acknowledged these results. Electronically Signed   By: Kreg Shropshire M.D.   On: 06/06/2020 21:31   Result Date: 06/06/2020 CLINICAL DATA:  Right upper quadrant pain, nondiagnostic ultrasound, elevated LFTs EXAM: MRI ABDOMEN WITHOUT AND WITH CONTRAST (INCLUDING MRCP) TECHNIQUE: Multiplanar multisequence MR imaging of the abdomen was performed both before and after the administration of intravenous contrast. Heavily T2-weighted images of the biliary and pancreatic ducts were obtained, and three-dimensional MRCP images were rendered by post processing. CONTRAST:  7.76mL GADAVIST GADOBUTROL 1  MMOL/ML IV SOLN COMPARISON:  CT 06/02/2020, CT angio chest 06/05/2020 FINDINGS: Lower chest: There are small bilateral effusions, right slightly greater than left with adjacent areas of atelectatic lung which appear fairly uniform in signal intensity though underlying infection is not fully excluded. Cardiac size upper limits normal Hepatobiliary: No focal liver lesions are evident. The liver surface is smooth. Normal intrinsic liver signal without significant dropout on out of phase imaging to suggest fatty infiltration. There is however some mild hyperemic enhancement adjacent the  gallbladder fossa which is likely reactive to the gallbladder inflammation. Additional mildly increased T2 signal about the biliary tree likely reflecting some periportal edema or inflammation. The gallbladder itself is markedly distended with a mixture of tumefactive sludge and more normal bile which is best seen on the source images and does extend into the normally tapering distal bile duct. There is diffuse gallbladder wall thickening and significant enhancement as well as bubbly foci T2 signal dropout towards the gallbladder fundus and within the gallbladder fossa concerning for emphysematous cholecystitis. Extensive surrounding T2 hyperintense inflammatory changes are present. Pancreas: Mild pancreatic atrophy. No visible enhancing or hypoenhancing lesions within the pancreas. No pancreatic ductal dilatation. Spleen: Normal in size. No concerning splenic lesions. Foci of signal dropout towards the splenic hilum compatible with calcifications seen on comparison is. Including a larger focus likely reflecting the suspected splenic artery aneurysm. Adrenals/Urinary Tract: Mild thickening of the left adrenal gland without discernible worrisome nodular mass compatible with adrenal hyperplasia. Mild bilateral perinephric stranding, nonspecific. Bilateral extrarenal pelves as well as several parapelvic renal cysts. No concerning renal  lesions. Stomach/Bowel: Some likely reactive edematous changes about the hepatic flexure which closely abuts the distended tip of the gallbladder. Vascular/Lymphatic: Calcifications towards the splenic hilum compatible with splenic artery aneurysm seen on comparison CT. No other major vascular abnormalities. No worrisome adenopathy. Other: Inflammatory changes about the gallbladder and right upper quadrant. Mild body wall edema. Emphysematous changes appear confined to the bladder wall without evidence of free air. Musculoskeletal: No concerning marrow signal abnormalities or abnormal cord signal. Degenerative changes are present throughout the spine without convincing acute osseous injury. These include some Modic type endplate changes multiple levels of spine and anterior thoracic disc spaces. IMPRESSION: 1. Gallbladder demonstrating features of acute emphysematous cholecystitis and containing a large volume of tumefactive sludge extending into the proximal cystic duct but without distal common bile duct stone. Extensive surrounding inflammatory changes and hyperemic hyperenhancement of the adjacent liver parenchyma along the gallbladder fossa. Some mild adjacent inflammatory changes upon the hepatic flexure as well. 2. There is diffusely increased T2 signal about the biliary tree which could reflect some periportal edema or inflammation/cholangitis. Correlate with clinical findings and laboratory values. 3. Small bilateral effusions, right slightly greater than left with adjacent areas of atelectatic lung. Underlying infection is not fully excluded. Currently attempting to contact the ordering provider with a critical value result. Addendum will be submitted upon case discussion. Electronically Signed: By: Kreg Shropshire M.D. On: 06/06/2020 21:20        Scheduled Meds: . citalopram  20 mg Oral Daily  . metoprolol tartrate  2.5 mg Intravenous Q8H  . pantoprazole (PROTONIX) IV  40 mg Intravenous Daily  .  potassium chloride  40 mEq Oral Once   Continuous Infusions: . sodium chloride Stopped (06/05/20 1526)  . sodium chloride 50 mL/hr at 06/06/20 2020  . azithromycin 500 mg (06/06/20 1159)  . cefTRIAXone (ROCEPHIN)  IV 2 g (06/06/20 1025)  . metronidazole 500 mg (06/07/20 0114)     LOS: 2 days    Time spent: 40 minutes    Ramiro Harvest, MD [facility]   To contact the attending provider between 7A-7P or the covering provider during after hours 7P-7A, please log into the web site [email or web address] and access using universal [place] password for that web site. If you do not have the password, please call the hospital operator.  06/07/2020, 9:11 AM

## 2020-06-07 NOTE — Anesthesia Procedure Notes (Signed)
Procedure Name: Intubation Performed by: Gean Maidens, CRNA Pre-anesthesia Checklist: Patient identified, Emergency Drugs available, Suction available, Patient being monitored and Timeout performed Patient Re-evaluated:Patient Re-evaluated prior to induction Oxygen Delivery Method: Circle system utilized Preoxygenation: Pre-oxygenation with 100% oxygen Induction Type: IV induction Ventilation: Mask ventilation without difficulty Laryngoscope Size: Mac and 3 Grade View: Grade II Tube type: Oral Tube size: 7.0 mm Number of attempts: 1 Airway Equipment and Method: Bougie stylet Placement Confirmation: ETT inserted through vocal cords under direct vision,  positive ETCO2 and breath sounds checked- equal and bilateral Secured at: 21 cm Tube secured with: Tape Dental Injury: Teeth and Oropharynx as per pre-operative assessment  Difficulty Due To: Difficulty was unanticipated and Difficult Airway- due to reduced neck mobility Comments: DL x1 CRNA grade 3 view DL x 2 MDA, bougie stylet used to successfully place ETT

## 2020-06-07 NOTE — Progress Notes (Signed)
[facility] Progress Note    Since last GI note: MRI with MRCP last night showed "Gallbladder demonstrating features of acute emphysematous cholecystitis and containing a large volume of tumefactive sludge extending into the proximal cystic duct but without distal common bile duct stone."  Surgery was notified.  She slept poorly again last night.  Pain is controlled with IV pain meds  Objective: Vital signs in last 24 hours: Temp:  [98 F (36.7 C)-98.2 F (36.8 C)] 98 F (36.7 C) (08/07 0552) Pulse Rate:  [78-87] 85 (08/07 0552) Resp:  [16-18] 16 (08/07 0552) BP: (139-162)/(72-86) 162/86 (08/07 0552) SpO2:  [90 %-93 %] 90 % (08/07 0552) Last BM Date: 06/03/20 General: alert and oriented times 3 Heart: regular rate and rythm Abdomen: soft, moderately tender epig/RUQ , non-distended, normal bowel sounds   Lab Results: Recent Labs    06/05/20 0901 06/06/20 0524 06/07/20 0603  WBC 21.0* 14.1* 11.2*  HGB 13.5 11.7* 11.6*  PLT 206 257 315  MCV 95.9 96.9 98.6   Recent Labs    06/05/20 0901 06/06/20 0524 06/07/20 0603  NA 132* 134* 136  K 3.7 3.7 3.3*  CL 97* 102 101  CO2 23 23 24   GLUCOSE 200* 136* 89  BUN 21 13 10   CREATININE 1.13* 0.58 0.53  CALCIUM 9.6 8.2* 8.2*   Recent Labs    06/05/20 0901 06/06/20 0524 06/07/20 0603  PROT 6.9 6.4* 5.8*  ALBUMIN 3.4* 2.4* 2.3*  AST 21 135* 64*  ALT 75* 134* 107*  ALKPHOS 103 223* 193*  BILITOT 1.2 2.8* 1.7*  BILIDIR  --   --  0.6*  IBILI  --   --  1.1*   Recent Labs    06/06/20 0524  INR 1.1   Medications: Scheduled Meds: . citalopram  40 mg Oral Daily  . enoxaparin (LOVENOX) injection  40 mg Subcutaneous Q24H  . metoprolol tartrate  2.5 mg Intravenous Q8H  . pantoprazole (PROTONIX) IV  40 mg Intravenous Daily   Continuous Infusions: . sodium chloride Stopped (06/05/20 1526)  . sodium chloride 50 mL/hr at 06/06/20 2020  . azithromycin 500 mg (06/06/20 1159)  . cefTRIAXone (ROCEPHIN)  IV 2 g  (06/06/20 1025)  . metronidazole 500 mg (06/07/20 0114)   PRN Meds:.sodium chloride, acetaminophen **OR** acetaminophen, morphine injection, ondansetron **OR** ondansetron (ZOFRAN) IV, oxyCODONE   Assessment/Plan: 79 y.o. female with acute cholecystitis  I suspect her LFTs are a reflection of her emph cholecystitis rather than sign of a concominant bile duct stone, obstruction.  General surgery is already aware and I suspect she will have cholecystectomy soon.  I am on call all weekend, please let me know if IOC is abnormal or if there are any other questions or concerns.   Please call or page with any further questions or concerns.   08/07/20, MD  06/07/2020, 6:41 AM [facility] Pager [phone]

## 2020-06-07 NOTE — Op Note (Signed)
Procedure Note  Pre-operative Diagnosis:  Acute emphysematous cholecystitis  Post-operative Diagnosis:  same  Surgeon:  Darnell Level, MD  Assistant:  Phylliss Blakes, MD   Procedure:  Laparoscopic cholecystectomy with intra-operative cholangiography  Anesthesia:  General  Estimated Blood Loss:  minimal  Drains: 19 Fr Blake drain to subhepatic space         Specimen: gallbladder to pathology  Indications: Patient is an 79 year old female admitted to the medical service with right upper quadrant abdominal pain.  Patient had had symptoms for approximately 2 weeks.  Evaluation included abdominal ultrasound, abdominal CT scan, and MRI scan.  Patient was seen in consultation by gastroenterology.  MRI demonstrated evidence of emphysematous cholecystitis.  Patient was evaluated by surgery and prepared for the operating room.  Procedure Details:  The patient was seen in the pre-op holding area. The risks, benefits, complications, treatment options, and expected outcomes were previously discussed with the patient. The patient agreed with the proposed plan and has signed the informed consent form.  The patient was transported to operating room #1 at the [facility]. The patient was placed in the supine position on the operating room table. Following induction of general anesthesia, the abdomen was prepped and draped in the usual aseptic fashion.  An incision was made in the skin near the umbilicus. The midline fascia was incised and the peritoneal cavity was entered and a Hasson cannula was introduced under direct vision. The cannula was secured with a 0-Vicryl pursestring suture. Pneumoperitoneum was established with carbon dioxide.  There was an extensive inflammatory process in the right upper quadrant with a large phlegmon.  With gentle blunt dissection the liver and fundus of the gallbladder were exposed.  Additional cannulae were introduced under direct vision along the right costal  margin in the midline, mid-clavicular line, and anterior axillary line.   The gallbladder was identified and the fundus grasped and retracted cephalad.  Gallbladder was punctured and aspirated with the aspirating trocar.  Adhesions were taken down bluntly and the electrocautery was utilized as needed, taking care not to involve any adjacent structures. The infundibulum was grasped and retracted laterally, exposing the peritoneum overlying the triangle of Calot.  With careful blunt dissection and judicious use of the electrocautery, the structures at the neck of the gallbladder were slowly identified.  Artery was divided between ligaclips with the electrocautery.  Cystic duct was carefully dissected out.  A clip was placed at the neck of the gallbladder.  Cholangiogram catheter was introduced and using the C arm, cholangiography was performed.  There appeared to be a long cystic duct.  There was filling of a normal caliber biliary system with reflux of contrast into the right and left hepatic ductal systems.  There was free flow distally into the duodenum without obstruction.  Clips were removed and the cholangiogram catheter was removed from the peritoneal cavity.  The cystic duct was then ligated with ligaclips and divided.  With some difficulty, the gallbladder was dissected away from the gallbladder bed using the electrocautery for hemostasis. The gallbladder was completely removed from the liver and placed into an endocatch bag. The gallbladder was removed in the endocatch bag through the umbilical port site and submitted to pathology for review.  The right upper quadrant was irrigated and the gallbladder bed was inspected. Hemostasis was achieved with the electrocautery.  Two pieces of "snow" were placed in the porta hepatis and over the gallbladder bed with good hemostasis noted by the conclusion of the procedure.  A 19 [place] Blake drain is brought in through the lateral port site and placed in the  subhepatic space.  It was secured to the skin with a 3-0 nylon suture.  Cannulae were removed under direct vision and good hemostasis was noted. Pneumoperitoneum was released and the majority of the carbon dioxide evacuated. The umbilical wound was irrigated and the fascia was then closed with the pursestring suture.  Local anesthetic was infiltrated at all port sites. Skin incisions were closed with 4-0 Monocril subcuticular sutures and Dermabond was applied.  Instrument, sponge, and needle counts were correct at the conclusion of the case.  The patient was awakened from anesthesia and brought to the recovery room in stable condition.  The patient tolerated the procedure well.   Darnell Level, MD [facility] Office: [phone]

## 2020-06-07 NOTE — Anesthesia Preprocedure Evaluation (Signed)
Anesthesia Evaluation  Patient identified by MRN, date of birth, ID band Patient awake    Reviewed: Allergy & Precautions, NPO status , Patient's Chart, lab work & pertinent test results  Airway Mallampati: II  TM Distance: >3 FB Neck ROM: Full    Dental no notable dental hx.    Pulmonary neg pulmonary ROS,    Pulmonary exam normal breath sounds clear to auscultation       Cardiovascular hypertension, Pt. on medications negative cardio ROS Normal cardiovascular exam Rhythm:Regular Rate:Normal     Neuro/Psych Anxiety Depression negative neurological ROS  negative psych ROS   GI/Hepatic Neg liver ROS, GERD  ,  Endo/Other  negative endocrine ROS  Renal/GU negative Renal ROS  negative genitourinary   Musculoskeletal negative musculoskeletal ROS (+)   Abdominal (+) + obese,   Peds negative pediatric ROS (+)  Hematology negative hematology ROS (+)   Anesthesia Other Findings   Reproductive/Obstetrics negative OB ROS                             Anesthesia Physical Anesthesia Plan  ASA: II  Anesthesia Plan: General   Post-op Pain Management:    Induction: Intravenous  PONV Risk Score and Plan: 3 and Ondansetron, Dexamethasone, Midazolam and Treatment may vary due to age or medical condition  Airway Management Planned: Oral ETT  Additional Equipment:   Intra-op Plan:   Post-operative Plan: Extubation in OR  Informed Consent: I have reviewed the patients History and Physical, chart, labs and discussed the procedure including the risks, benefits and alternatives for the proposed anesthesia with the patient or authorized representative who has indicated his/her understanding and acceptance.     Dental advisory given  Plan Discussed with: CRNA  Anesthesia Plan Comments:         Anesthesia Quick Evaluation

## 2020-06-07 NOTE — Transfer of Care (Signed)
Immediate Anesthesia Transfer of Care Note  Patient: Margaret Cross  Procedure(s) Performed: LAPAROSCOPIC CHOLECYSTECTOMY WITH INTRAOPERATIVE CHOLANGIOGRAM (N/A Abdomen)  Patient Location: PACU  Anesthesia Type:General  Level of Consciousness: sedated, patient cooperative and responds to stimulation  Airway & Oxygen Therapy: Patient Spontanous Breathing and Patient connected to face mask oxygen  Post-op Assessment: Report given to RN and Post -op Vital signs reviewed and stable  Post vital signs: Reviewed and stable  Last Vitals:  Vitals Value Taken Time  BP    Temp    Pulse 87 06/07/20 1333  Resp 20 06/07/20 1333  SpO2 91 % 06/07/20 1333  Vitals shown include unvalidated device data.  Last Pain:  Vitals:   06/07/20 0552  TempSrc: Oral  PainSc:          Complications: No complications documented.

## 2020-06-07 NOTE — Anesthesia Postprocedure Evaluation (Signed)
Anesthesia Post Note  Patient: Margaret Cross  Procedure(s) Performed: LAPAROSCOPIC CHOLECYSTECTOMY WITH INTRAOPERATIVE CHOLANGIOGRAM (N/A Abdomen)     Patient location during evaluation: PACU Anesthesia Type: General Level of consciousness: awake and alert Pain management: pain level controlled Vital Signs Assessment: post-procedure vital signs reviewed and stable Respiratory status: spontaneous breathing, nonlabored ventilation and respiratory function stable Cardiovascular status: blood pressure returned to baseline and stable Postop Assessment: no apparent nausea or vomiting Anesthetic complications: no   No complications documented.  Last Vitals:  Vitals:   06/07/20 1345 06/07/20 1400  BP: (!) 158/77 (!) 163/79  Pulse: 86 91  Resp: (!) 23 20  Temp:    SpO2: 95% 95%    Last Pain:  Vitals:   06/07/20 1355  TempSrc:   PainSc: 5                  Lowella Curb

## 2020-06-07 NOTE — Consult Note (Signed)
General Surgery [facility]  Reason for Consult: abdominal pain, emphysematous cholecystitis  Referring Physician: Dr. Marissa NestleKrackakandy, [facility]  Margaret Cross is an 79 y.o. female.  HPI: Patient is an 79 year old female admitted on the medical service with abdominal pain.  Patient has undergone an extensive work-up including abdominal CT scan, abdominal ultrasound, and MRCP.  She has been evaluated by gastroenterology.  Laboratory studies were notable for leukocytosis and elevated liver enzymes.  Study from yesterday evening demonstrates evidence of emphysematous cholecystitis with gallbladder sludge.  Patient has had abdominal pain radiating to the back for nearly 2 weeks.  Prior abdominal surgery includes hysterectomy and urologic surgery on the distal ureter.  She has had no prior upper abdominal surgery.  She denies any history of jaundice or acholic stools.  She denies any history of hepatitis or pancreatitis.  She does have a hiatal hernia and gastroesophageal reflux disease.  General surgery is now consulted for consideration for urgent cholecystectomy for management of emphysematous cholecystitis.  Past Medical History:  Diagnosis Date  . Depression   . GERD (gastroesophageal reflux disease)   . High cholesterol   . Hypertension     Past Surgical History:  Procedure Laterality Date  . ABDOMINAL HYSTERECTOMY    . KIDNEY SURGERY      Family History  Problem Relation Age of Onset  . Heart disease Father     Social History:  reports that she has never smoked. She has never used smokeless tobacco. She reports previous alcohol use. She reports that she does not use drugs.  Allergies:  Allergies  Allergen Reactions  . Bupropion Other (See Comments)    Headache and hand tremors  . Sulfa Antibiotics Nausea And Vomiting    rash Other reaction(s): Other (See Comments) unknown unknown     Medications: I have reviewed the patient's current  medications.  Results for orders placed or performed during the hospital encounter of 06/05/20 (from the past 48 hour(s))  Comprehensive metabolic panel     Status: Abnormal   Collection Time: 06/05/20  9:01 AM  Result Value Ref Range   Sodium 132 (L) 135 - 145 mmol/L   Potassium 3.7 3.5 - 5.1 mmol/L   Chloride 97 (L) 98 - 111 mmol/L   CO2 23 22 - 32 mmol/L   Glucose, Bld 200 (H) 70 - 99 mg/dL    Comment: Glucose reference range applies only to samples taken after fasting for at least 8 hours.   BUN 21 8 - 23 mg/dL   Creatinine, Ser 7.931.13 (H) 0.44 - 1.00 mg/dL   Calcium 9.6 8.9 - 90.310.3 mg/dL   Total Protein 6.9 6.5 - 8.1 g/dL   Albumin 3.4 (L) 3.5 - 5.0 g/dL   AST 21 15 - 41 U/L   ALT 75 (H) 0 - 44 U/L   Alkaline Phosphatase 103 38 - 126 U/L   Total Bilirubin 1.2 0.3 - 1.2 mg/dL   GFR calc non Af Amer 46 (L) >60 mL/min   GFR calc Af Amer 53 (L) >60 mL/min   Anion gap 12 5 - 15    Comment: Performed at [facility], [address], [place], [place] [id number]  CBC with Differential     Status: Abnormal   Collection Time: 06/05/20  9:01 AM  Result Value Ref Range   WBC 21.0 (H) 4.0 - 10.5 K/uL   RBC 4.19 3.87 - 5.11 MIL/uL   Hemoglobin 13.5  12.0 - 15.0 g/dL   HCT 99.7 36 - 46 %   MCV 95.9 80.0 - 100.0 fL   MCH 32.2 26.0 - 34.0 pg   MCHC 33.6 30.0 - 36.0 g/dL   RDW 74.1 42.3 - 95.3 %   Platelets 206 150 - 400 K/uL   nRBC 0.0 0.0 - 0.2 %   Neutrophils Relative % 90 %   Neutro Abs 18.9 (H) 1.7 - 7.7 K/uL   Lymphocytes Relative 4 %   Lymphs Abs 0.9 0.7 - 4.0 K/uL   Monocytes Relative 4 %   Monocytes Absolute 0.8 0 - 1 K/uL   Eosinophils Relative 0 %   Eosinophils Absolute 0.0 0 - 0 K/uL   Basophils Relative 0 %   Basophils Absolute 0.1 0 - 0 K/uL   Immature Granulocytes 2 %   Abs Immature Granulocytes 0.34 (H) 0.00 - 0.07 K/uL    Comment: Performed at [facility], [address] [place], [place] [address]  Lipase, blood      Status: None   Collection Time: 06/05/20  9:01 AM  Result Value Ref Range   Lipase 26 11 - 51 U/L    Comment: Performed at [facility], [address], [place], [place] [address]  Culture, blood (routine x 2)     Status: None (Preliminary result)   Collection Time: 06/05/20 10:00 AM   Specimen: BLOOD  Result Value Ref Range   Specimen Description      BLOOD LEFT ANTECUBITAL Performed at [facility], [address] [place], [place] [address]    Special Requests      BOTTLES DRAWN AEROBIC AND ANAEROBIC Blood Culture adequate volume Performed at [facility], [address] [place], [place] [address]    Culture      NO GROWTH 1 DAY Performed at [facility], [address] [place], [place] [address]    Report Status PENDING   Lactic acid, plasma     Status: None   Collection Time: 06/05/20 10:21 AM  Result Value Ref Range   Lactic Acid, Venous 1.1 0.5 - 1.9 mmol/L    Comment: Performed at [facility], [address] [place], [place] [address]  SARS Coronavirus 2 by RT PCR (hospital order, performed in [facility] hospital lab) Nasopharyngeal Nasopharyngeal Swab     Status: None   Collection Time: 06/05/20 10:21 AM   Specimen: Nasopharyngeal Swab  Result Value Ref Range   SARS Coronavirus 2 NEGATIVE NEGATIVE    Comment: (NOTE) SARS-CoV-2 target nucleic acids are NOT DETECTED.  The SARS-CoV-2 RNA is generally detectable in upper and lower respiratory specimens during the acute phase of infection. The lowest concentration of SARS-CoV-2 viral copies this assay can detect is 250 copies / mL. A negative result does not preclude SARS-CoV-2 infection and should not be used as the sole basis for treatment or other patient management decisions.  A negative result may occur with improper specimen collection / handling, submission of specimen other than nasopharyngeal swab, presence of viral mutation(s) within the areas targeted by this  assay, and inadequate number of viral copies (<250 copies / mL). A negative result must be combined with clinical observations, patient history, and epidemiological information.  Fact Sheet for Patients:   BoilerBrush.com.cy  Fact Sheet for Healthcare Providers: https://pope.com/  This test is not yet approved or  cleared by the [place] FDA and has been authorized for  detection and/or diagnosis of SARS-CoV-2 by FDA under an Emergency Use Authorization (EUA).  This EUA will remain in effect (meaning this test can be used) for the duration of the COVID-19 declaration under Section 564(b)(1) of the Act, 21 U.S.C. section 360bbb-3(b)(1), unless the authorization is terminated or revoked sooner.  Performed at [facility], [address] [place], [place] [address]   Culture, blood (routine x 2)     Status: None (Preliminary result)   Collection Time: 06/05/20 10:30 AM   Specimen: BLOOD  Result Value Ref Range   Specimen Description      BLOOD BLOOD LEFT HAND Performed at [facility], [address] [place], [place] [address]    Special Requests      BOTTLES DRAWN AEROBIC AND ANAEROBIC Blood Culture adequate volume Performed at [facility], [address] [place], [place] [address]    Culture      NO GROWTH 1 DAY Performed at [facility], [address], [place], [place] [address]    Report Status PENDING   Urinalysis, Routine w reflex microscopic Urine, Clean Catch     Status: Abnormal   Collection Time: 06/05/20 11:40 AM  Result Value Ref Range   Color, Urine YELLOW YELLOW   APPearance CLEAR CLEAR   Specific Gravity, Urine <1.005 (L) 1.005 - 1.030   pH 6.5 5.0 - 8.0   Glucose, UA NEGATIVE NEGATIVE mg/dL   Hgb urine dipstick NEGATIVE NEGATIVE   Bilirubin Urine MODERATE (A) NEGATIVE   Ketones, ur NEGATIVE NEGATIVE mg/dL   Protein, ur 30 (A) NEGATIVE mg/dL   Nitrite NEGATIVE  NEGATIVE   Leukocytes,Ua NEGATIVE NEGATIVE    Comment: Performed at [facility], [address] [place], [place] [address]  Urinalysis, Microscopic (reflex)     Status: Abnormal   Collection Time: 06/05/20 11:40 AM  Result Value Ref Range   RBC / HPF 6-10 0 - 5 RBC/hpf   WBC, UA 6-10 0 - 5 WBC/hpf   Bacteria, UA FEW (A) NONE SEEN   Squamous Epithelial / LPF 6-10 0 - 5   Mucus PRESENT     Comment: Performed at [facility], [address] [place], [place] [address]  HIV Antibody (routine testing w rflx)     Status: None   Collection Time: 06/06/20  5:24 AM  Result Value Ref Range   HIV Screen 4th Generation wRfx Non Reactive Non Reactive    Comment: Performed at [facility], [address] [place], [place] [address]  Comprehensive metabolic panel     Status: Abnormal   Collection Time: 06/06/20  5:24 AM  Result Value Ref Range   Sodium 134 (L) 135 - 145 mmol/L   Potassium 3.7 3.5 - 5.1 mmol/L   Chloride 102 98 - 111 mmol/L   CO2 23 22 - 32 mmol/L   Glucose, Bld 136 (H) 70 - 99 mg/dL    Comment: Glucose reference range applies only to samples taken after fasting for at least 8 hours.   BUN 13 8 - 23 mg/dL   Creatinine, Ser 2.44 0.44 - 1.00 mg/dL   Calcium 8.2 (L) 8.9 - 10.3 mg/dL   Total Protein 6.4 (L) 6.5 - 8.1 g/dL   Albumin 2.4 (L) 3.5 - 5.0 g/dL   AST 010 (H) 15 - 41 U/L   ALT 134 (H) 0 - 44 U/L   Alkaline Phosphatase 223 (H)  38 - 126 U/L   Total Bilirubin 2.8 (H) 0.3 - 1.2 mg/dL   GFR calc non Af Amer >60 >60 mL/min   GFR calc Af Amer >60 >60 mL/min   Anion gap 9 5 - 15    Comment: Performed at [facility], [address], [place], [place] [address]  CBC     Status: Abnormal   Collection Time: 06/06/20  5:24 AM  Result Value Ref Range   WBC 14.1 (H) 4.0 - 10.5 K/uL   RBC 3.59 (L) 3.87 - 5.11 MIL/uL   Hemoglobin 11.7 (L) 12.0 - 15.0 g/dL   HCT 62.3 (L) 36 - 46 %   MCV 96.9 80.0 - 100.0 fL   MCH 32.6 26.0 - 34.0 pg    MCHC 33.6 30.0 - 36.0 g/dL   RDW 76.2 83.1 - 51.7 %   Platelets 257 150 - 400 K/uL   nRBC 0.0 0.0 - 0.2 %    Comment: Performed at [facility], [address], [place], [place] [address]  Protime-INR     Status: None   Collection Time: 06/06/20  5:24 AM  Result Value Ref Range   Prothrombin Time 13.6 11.4 - 15.2 seconds   INR 1.1 0.8 - 1.2    Comment: (NOTE) INR goal varies based on device and disease states. Performed at [facility], [address] [place], [place] [address]   Hemoglobin A1c     Status: Abnormal   Collection Time: 06/06/20  5:24 AM  Result Value Ref Range   Hgb A1c MFr Bld 6.3 (H) 4.8 - 5.6 %    Comment: (NOTE) Pre diabetes:          5.7%-6.4%  Diabetes:              >6.4%  Glycemic control for   <7.0% adults with diabetes    Mean Plasma Glucose 134.11 mg/dL    Comment: Performed at [facility], [address], [place], [place] [address]  Hepatitis panel, acute     Status: None   Collection Time: 06/06/20  8:58 AM  Result Value Ref Range   Hepatitis B Surface Ag NON REACTIVE NON REACTIVE   HCV Ab NON REACTIVE NON REACTIVE    Comment: (NOTE) Nonreactive HCV antibody screen is consistent with no HCV infections,  unless recent infection is suspected or other evidence exists to indicate HCV infection.     Hep A IgM NON REACTIVE NON REACTIVE   Hep B C IgM NON REACTIVE NON REACTIVE    Comment: Performed at [facility], [address], [place], [place] [address]  CBC with Differential/Platelet     Status: Abnormal   Collection Time: 06/07/20  6:03 AM  Result Value Ref Range   WBC 11.2 (H) 4.0 - 10.5 K/uL   RBC 3.55 (L) 3.87 - 5.11 MIL/uL   Hemoglobin 11.6 (L) 12.0 - 15.0 g/dL   HCT 03.5 (L) 36 - 46 %   MCV 98.6 80.0 - 100.0 fL   MCH 32.7 26.0 - 34.0 pg   MCHC 33.1 30.0 - 36.0 g/dL   RDW 00.9 38.1 - 82.9 %   Platelets 315 150 - 400 K/uL   nRBC 0.0 0.0 - 0.2 %   Neutrophils Relative % 85 %    Neutro Abs 9.4 (H) 1.7 - 7.7 K/uL   Lymphocytes Relative 8 %   Lymphs Abs 0.9 0.7 - 4.0 K/uL   Monocytes Relative  6 %   Monocytes Absolute 0.7 0 - 1 K/uL   Eosinophils Relative 0 %   Eosinophils Absolute 0.1 0 - 0 K/uL   Basophils Relative 0 %   Basophils Absolute 0.0 0 - 0 K/uL   Immature Granulocytes 1 %   Abs Immature Granulocytes 0.08 (H) 0.00 - 0.07 K/uL    Comment: Performed at [facility], [address] [place], [place] [address]  Hepatic function panel     Status: Abnormal   Collection Time: 06/07/20  6:03 AM  Result Value Ref Range   Total Protein 5.8 (L) 6.5 - 8.1 g/dL   Albumin 2.3 (L) 3.5 - 5.0 g/dL   AST 64 (H) 15 - 41 U/L   ALT 107 (H) 0 - 44 U/L   Alkaline Phosphatase 193 (H) 38 - 126 U/L   Total Bilirubin 1.7 (H) 0.3 - 1.2 mg/dL   Bilirubin, Direct 0.6 (H) 0.0 - 0.2 mg/dL   Indirect Bilirubin 1.1 (H) 0.3 - 0.9 mg/dL    Comment: Performed at [facility], [address], [place], [place] [address]  Basic metabolic panel     Status: Abnormal   Collection Time: 06/07/20  6:03 AM  Result Value Ref Range   Sodium 136 135 - 145 mmol/L   Potassium 3.3 (L) 3.5 - 5.1 mmol/L   Chloride 101 98 - 111 mmol/L   CO2 24 22 - 32 mmol/L   Glucose, Bld 89 70 - 99 mg/dL    Comment: Glucose reference range applies only to samples taken after fasting for at least 8 hours.   BUN 10 8 - 23 mg/dL   Creatinine, Ser 9.37 0.44 - 1.00 mg/dL   Calcium 8.2 (L) 8.9 - 10.3 mg/dL   GFR calc non Af Amer >60 >60 mL/min   GFR calc Af Amer >60 >60 mL/min   Anion gap 11 5 - 15    Comment: Performed at [facility], [address], [place], [place] [address]  Magnesium     Status: None   Collection Time: 06/07/20  6:03 AM  Result Value Ref Range   Magnesium 2.1 1.7 - 2.4 mg/dL    Comment: Performed at [facility], [address], [place], [place] [address]  Iron     Status: None   Collection Time: 06/07/20  6:03 AM   Result Value Ref Range   Iron 40 28 - 170 ug/dL    Comment: Performed at [facility], [address], [place], [place] [address]  Ferritin     Status: None   Collection Time: 06/07/20  6:03 AM  Result Value Ref Range   Ferritin 271 11 - 307 ng/mL    Comment: Performed at [facility], [address] [place], [place] [address]    CT Angio Chest PE W and/or Wo Contrast  Result Date: 06/05/2020 CLINICAL DATA:  Right upper quadrant pain EXAM: CT ANGIOGRAPHY CHEST WITH CONTRAST TECHNIQUE: Multidetector CT imaging of the chest was performed using the standard protocol during bolus administration of intravenous contrast. Multiplanar CT image reconstructions and MIPs were obtained to evaluate the vascular anatomy. CONTRAST:  OMNIPAQUE IOHEXOL 350 MG/ML SOLN COMPARISON:  None. FINDINGS: Cardiovascular: No filling defects in the pulmonary arteries to suggest pulmonary emboli. Aortic atherosclerosis and coronary artery disease. Heart is normal size. Aorta normal caliber. Mediastinum/Nodes: No mediastinal, hilar, or axillary adenopathy. Moderate-sized hiatal hernia. Thyroid unremarkable. Lungs/Pleura: Bilateral lower lobe airspace opacities,  right greater than left could reflect atelectasis or infiltrate/pneumonia. Trace right pleural effusion. Upper Abdomen: Calcifications in the spleen compatible with old granulomatous disease. Several small calcified splenic artery aneurysms measuring up to 12 mm. No acute findings. Musculoskeletal: Chest wall soft tissues are unremarkable. Mild compression fracture through the superior endplate of a lower thoracic vertebral body, likely T10. Review of the MIP images confirms the above findings. IMPRESSION: No evidence of pulmonary embolus. Bilateral lower lobe airspace opacities, right greater than left which could reflect atelectasis or pneumonia. Trace right pleural effusion. Coronary artery disease. Several small splenic artery  aneurysms measuring up to 12 mm. 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 Moderate-sized hiatal hernia. Mild compression fracture through the superior endplate at T10, age indeterminate. Aortic Atherosclerosis (ICD10-I70.0). Electronically Signed   By: Charlett Nose M.D.   On: 06/05/2020 10:34   MR 3D Recon At Scanner  Addendum Date: 06/06/2020   ADDENDUM REPORT: 06/06/2020 21:31 ADDENDUM: These results were called by telephone on 06/06/2020 at 9:31 pm to provider Dr Toniann Fail, who verbally acknowledged these results. Electronically Signed   By: Kreg Shropshire M.D.   On: 06/06/2020 21:31   Result Date: 06/06/2020 CLINICAL DATA:  Right upper quadrant pain, nondiagnostic ultrasound, elevated LFTs EXAM: MRI ABDOMEN WITHOUT AND WITH CONTRAST (INCLUDING MRCP) TECHNIQUE: Multiplanar multisequence MR imaging of the abdomen was performed both before and after the administration of intravenous contrast. Heavily T2-weighted images of the biliary and pancreatic ducts were obtained, and three-dimensional MRCP images were rendered by post processing. CONTRAST:  7.46mL GADAVIST GADOBUTROL 1 MMOL/ML IV SOLN COMPARISON:  CT 06/02/2020, CT angio chest 06/05/2020 FINDINGS: Lower chest: There are small bilateral effusions, right slightly greater than left with adjacent areas of atelectatic lung which appear fairly uniform in signal intensity though underlying infection is not fully excluded. Cardiac size upper limits normal Hepatobiliary: No focal liver lesions are evident. The liver surface is smooth. Normal intrinsic liver signal without significant dropout on out of phase imaging to suggest fatty infiltration. There is however some mild hyperemic enhancement adjacent the gallbladder fossa which is likely reactive to the gallbladder inflammation. Additional mildly increased T2 signal about the biliary tree likely reflecting some periportal edema or inflammation. The gallbladder itself is markedly distended with a mixture of  tumefactive sludge and more normal bile which is best seen on the source images and does extend into the normally tapering distal bile duct. There is diffuse gallbladder wall thickening and significant enhancement as well as bubbly foci T2 signal dropout towards the gallbladder fundus and within the gallbladder fossa concerning for emphysematous cholecystitis. Extensive surrounding T2 hyperintense inflammatory changes are present. Pancreas: Mild pancreatic atrophy. No visible enhancing or hypoenhancing lesions within the pancreas. No pancreatic ductal dilatation. Spleen: Normal in size. No concerning splenic lesions. Foci of signal dropout towards the splenic hilum compatible with calcifications seen on comparison is. Including a larger focus likely reflecting the suspected splenic artery aneurysm. Adrenals/Urinary Tract: Mild thickening of the left adrenal gland without discernible worrisome nodular mass compatible with adrenal hyperplasia. Mild bilateral perinephric stranding, nonspecific. Bilateral extrarenal pelves as well as several parapelvic renal cysts. No concerning renal lesions. Stomach/Bowel: Some likely reactive edematous changes about the hepatic flexure which closely abuts the distended tip of the gallbladder. Vascular/Lymphatic: Calcifications towards the splenic hilum compatible with splenic artery aneurysm seen on comparison CT. No other major vascular abnormalities. No worrisome adenopathy. Other: Inflammatory changes about the gallbladder and right upper quadrant. Mild body wall edema. Emphysematous changes appear confined  to the bladder wall without evidence of free air. Musculoskeletal: No concerning marrow signal abnormalities or abnormal cord signal. Degenerative changes are present throughout the spine without convincing acute osseous injury. These include some Modic type endplate changes multiple levels of spine and anterior thoracic disc spaces. IMPRESSION: 1. Gallbladder demonstrating  features of acute emphysematous cholecystitis and containing a large volume of tumefactive sludge extending into the proximal cystic duct but without distal common bile duct stone. Extensive surrounding inflammatory changes and hyperemic hyperenhancement of the adjacent liver parenchyma along the gallbladder fossa. Some mild adjacent inflammatory changes upon the hepatic flexure as well. 2. There is diffusely increased T2 signal about the biliary tree which could reflect some periportal edema or inflammation/cholangitis. Correlate with clinical findings and laboratory values. 3. Small bilateral effusions, right slightly greater than left with adjacent areas of atelectatic lung. Underlying infection is not fully excluded. Currently attempting to contact the ordering provider with a critical value result. Addendum will be submitted upon case discussion. Electronically Signed: By: Kreg Shropshire M.D. On: 06/06/2020 21:20   DG Chest Port 1 View  Result Date: 06/05/2020 CLINICAL DATA:  RIGHT upper quadrant pain EXAM: PORTABLE CHEST 1 VIEW COMPARISON:  June 02, 2020 FINDINGS: The cardiomediastinal silhouette is unchanged in contour.Elevation of the RIGHT hemidiaphragm. No pleural effusion. No pneumothorax. Increased RIGHT middle lobe linear opacity. Increased LEFT lower lung predominately linear opacity. Visualized abdomen is unremarkable. Degenerative changes of bilateral shoulders. IMPRESSION: Increased RIGHT middle lobe and LEFT lower lung predominately linear opacities, favored to represent atelectasis. Superimposed infection or aspiration remains in the differential. Electronically Signed   By: Meda Klinefelter MD   On: 06/05/2020 09:38   MR ABDOMEN MRCP W WO CONTAST  Addendum Date: 06/06/2020   ADDENDUM REPORT: 06/06/2020 21:31 ADDENDUM: These results were called by telephone on 06/06/2020 at 9:31 pm to provider Dr Toniann Fail, who verbally acknowledged these results. Electronically Signed   By: Kreg Shropshire  M.D.   On: 06/06/2020 21:31   Result Date: 06/06/2020 CLINICAL DATA:  Right upper quadrant pain, nondiagnostic ultrasound, elevated LFTs EXAM: MRI ABDOMEN WITHOUT AND WITH CONTRAST (INCLUDING MRCP) TECHNIQUE: Multiplanar multisequence MR imaging of the abdomen was performed both before and after the administration of intravenous contrast. Heavily T2-weighted images of the biliary and pancreatic ducts were obtained, and three-dimensional MRCP images were rendered by post processing. CONTRAST:  7.87mL GADAVIST GADOBUTROL 1 MMOL/ML IV SOLN COMPARISON:  CT 06/02/2020, CT angio chest 06/05/2020 FINDINGS: Lower chest: There are small bilateral effusions, right slightly greater than left with adjacent areas of atelectatic lung which appear fairly uniform in signal intensity though underlying infection is not fully excluded. Cardiac size upper limits normal Hepatobiliary: No focal liver lesions are evident. The liver surface is smooth. Normal intrinsic liver signal without significant dropout on out of phase imaging to suggest fatty infiltration. There is however some mild hyperemic enhancement adjacent the gallbladder fossa which is likely reactive to the gallbladder inflammation. Additional mildly increased T2 signal about the biliary tree likely reflecting some periportal edema or inflammation. The gallbladder itself is markedly distended with a mixture of tumefactive sludge and more normal bile which is best seen on the source images and does extend into the normally tapering distal bile duct. There is diffuse gallbladder wall thickening and significant enhancement as well as bubbly foci T2 signal dropout towards the gallbladder fundus and within the gallbladder fossa concerning for emphysematous cholecystitis. Extensive surrounding T2 hyperintense inflammatory changes are present. Pancreas: Mild pancreatic atrophy. No visible  enhancing or hypoenhancing lesions within the pancreas. No pancreatic ductal dilatation.  Spleen: Normal in size. No concerning splenic lesions. Foci of signal dropout towards the splenic hilum compatible with calcifications seen on comparison is. Including a larger focus likely reflecting the suspected splenic artery aneurysm. Adrenals/Urinary Tract: Mild thickening of the left adrenal gland without discernible worrisome nodular mass compatible with adrenal hyperplasia. Mild bilateral perinephric stranding, nonspecific. Bilateral extrarenal pelves as well as several parapelvic renal cysts. No concerning renal lesions. Stomach/Bowel: Some likely reactive edematous changes about the hepatic flexure which closely abuts the distended tip of the gallbladder. Vascular/Lymphatic: Calcifications towards the splenic hilum compatible with splenic artery aneurysm seen on comparison CT. No other major vascular abnormalities. No worrisome adenopathy. Other: Inflammatory changes about the gallbladder and right upper quadrant. Mild body wall edema. Emphysematous changes appear confined to the bladder wall without evidence of free air. Musculoskeletal: No concerning marrow signal abnormalities or abnormal cord signal. Degenerative changes are present throughout the spine without convincing acute osseous injury. These include some Modic type endplate changes multiple levels of spine and anterior thoracic disc spaces. IMPRESSION: 1. Gallbladder demonstrating features of acute emphysematous cholecystitis and containing a large volume of tumefactive sludge extending into the proximal cystic duct but without distal common bile duct stone. Extensive surrounding inflammatory changes and hyperemic hyperenhancement of the adjacent liver parenchyma along the gallbladder fossa. Some mild adjacent inflammatory changes upon the hepatic flexure as well. 2. There is diffusely increased T2 signal about the biliary tree which could reflect some periportal edema or inflammation/cholangitis. Correlate with clinical findings and  laboratory values. 3. Small bilateral effusions, right slightly greater than left with adjacent areas of atelectatic lung. Underlying infection is not fully excluded. Currently attempting to contact the ordering provider with a critical value result. Addendum will be submitted upon case discussion. Electronically Signed: By: Kreg Shropshire M.D. On: 06/06/2020 21:20    Review of Systems  Constitutional: Positive for appetite change.  HENT: Negative.   Eyes: Negative.   Respiratory: Negative.   Cardiovascular: Negative.   Gastrointestinal: Positive for abdominal pain.  Endocrine: Negative.   Genitourinary: Negative.   Musculoskeletal: Positive for back pain.  Skin: Negative.   Allergic/Immunologic: Negative.   Neurological: Negative.   Hematological: Negative.   Psychiatric/Behavioral: Negative.     Physical Exam   Blood pressure (!) 162/86, pulse 85, temperature 98 F (36.7 C), temperature source Oral, resp. rate 16, height 5\' 7"  (1.702 m), weight 90.2 kg, SpO2 90 %.  CONSTITUTIONAL: no acute distress; conversant; no obvious deformities  EYES: conjunctiva moist; no lid lag; anicteric; pupils equal bilaterally  NECK: trachea midline; no thyroid nodularity  LUNGS: respiratory effort normal & unlabored; no wheeze; no rales; no tactile fremitus  CV: rate and rhythm regular; no palpable thrills; no murmur; no edema bilat lower extremities  GI: abdomen is soft with mild distention.  Mild to moderate tenderness to palpation right upper quadrant with voluntary guarding.  No palpable mass; no hepatosplenomegaly; no obvious hernia  MSK: normal range of motion of extremities; no clubbing; no cyanosis  PSYCH: appropriate affect for situation; alert and oriented to person, place, & time  LYMPHATIC: no palpable cervical lymphadenopathy; no evidence lymphedema in extremities    Assessment/Plan: Acute emphysematous cholecystitis  Patient seen and examined this morning.  Studies  reviewed.  Consult reports reviewed.  Agree with assessment that patient does require cholecystectomy.  She and I discussed laparoscopic cholecystectomy with cholangiography versus the possibility of open surgery.  We discussed  the location of the surgical incisions, the length of the procedure, and the potential hospital stay following surgery.  The patient understands and wishes to proceed with surgery as soon as possible.  I will contact the operating room this morning.  There are a few other cases already in progress.  We will attempt to put her on the operating room schedule for later today.  Orders are entered.  The risks and benefits of the procedure have been discussed at length with the patient.  The patient understands the proposed procedure, potential alternative treatments, and the course of recovery to be expected.  All of the patient's questions have been answered at this time.  The patient wishes to proceed with surgery.  Darnell Level, MD [facility] Office: [phone]    Darnell Level 06/07/2020, 7:41 AM

## 2020-06-08 ENCOUNTER — Encounter (HOSPITAL_COMMUNITY): Payer: Self-pay | Admitting: Surgery

## 2020-06-08 LAB — COMPREHENSIVE METABOLIC PANEL
ALT: 75 U/L — ABNORMAL HIGH (ref 0–44)
AST: 28 U/L (ref 15–41)
Albumin: 2.3 g/dL — ABNORMAL LOW (ref 3.5–5.0)
Alkaline Phosphatase: 155 U/L — ABNORMAL HIGH (ref 38–126)
Anion gap: 9 (ref 5–15)
BUN: 10 mg/dL (ref 8–23)
CO2: 25 mmol/L (ref 22–32)
Calcium: 8.5 mg/dL — ABNORMAL LOW (ref 8.9–10.3)
Chloride: 102 mmol/L (ref 98–111)
Creatinine, Ser: 0.6 mg/dL (ref 0.44–1.00)
GFR calc Af Amer: >60 mL/min (ref >60)
GFR calc non Af Amer: >60 mL/min (ref >60)
Glucose, Bld: 119 mg/dL — ABNORMAL HIGH (ref 70–99)
Potassium: 4 mmol/L (ref 3.5–5.1)
Sodium: 136 mmol/L (ref 135–145)
Total Bilirubin: 1.1 mg/dL (ref 0.3–1.2)
Total Protein: 5.8 g/dL — ABNORMAL LOW (ref 6.5–8.1)

## 2020-06-08 LAB — CBC WITH DIFFERENTIAL/PLATELET
Abs Immature Granulocytes: 0.14 10*3/uL — ABNORMAL HIGH (ref 0.00–0.07)
Basophils Absolute: 0 10*3/uL (ref 0.0–0.1)
Basophils Relative: 0 %
Eosinophils Absolute: 0 10*3/uL (ref 0.0–0.5)
Eosinophils Relative: 0 %
HCT: 34.7 % — ABNORMAL LOW (ref 36.0–46.0)
Hemoglobin: 11.6 g/dL — ABNORMAL LOW (ref 12.0–15.0)
Immature Granulocytes: 1 %
Lymphocytes Relative: 8 %
Lymphs Abs: 1.1 10*3/uL (ref 0.7–4.0)
MCH: 32.6 pg (ref 26.0–34.0)
MCHC: 33.4 g/dL (ref 30.0–36.0)
MCV: 97.5 fL (ref 80.0–100.0)
Monocytes Absolute: 1.1 10*3/uL — ABNORMAL HIGH (ref 0.1–1.0)
Monocytes Relative: 7 %
Neutro Abs: 12.1 10*3/uL — ABNORMAL HIGH (ref 1.7–7.7)
Neutrophils Relative %: 84 %
Platelets: 322 10*3/uL (ref 150–400)
RBC: 3.56 MIL/uL — ABNORMAL LOW (ref 3.87–5.11)
RDW: 13.2 % (ref 11.5–15.5)
WBC: 14.5 10*3/uL — ABNORMAL HIGH (ref 4.0–10.5)
nRBC: 0 % (ref 0.0–0.2)

## 2020-06-08 LAB — ALPHA-1-ANTITRYPSIN: A-1 Antitrypsin, Ser: 300 mg/dL — ABNORMAL HIGH (ref 101–187)

## 2020-06-08 LAB — IGG: IgG (Immunoglobin G), Serum: 747 mg/dL (ref 586–1602)

## 2020-06-08 MED ORDER — GUAIFENESIN ER 600 MG PO TB12
1200.0000 mg | ORAL_TABLET | Freq: Two times a day (BID) | ORAL | Status: DC
  Administered 2020-06-09 – 2020-06-12 (×4): 1200 mg via ORAL
  Filled 2020-06-08 (×8): qty 2

## 2020-06-08 MED ORDER — METRONIDAZOLE 500 MG PO TABS
500.0000 mg | ORAL_TABLET | Freq: Three times a day (TID) | ORAL | Status: AC
  Administered 2020-06-08 – 2020-06-10 (×8): 500 mg via ORAL
  Filled 2020-06-08 (×8): qty 1

## 2020-06-08 MED ORDER — AZITHROMYCIN 250 MG PO TABS
500.0000 mg | ORAL_TABLET | Freq: Every day | ORAL | Status: AC
  Administered 2020-06-08 – 2020-06-09 (×2): 500 mg via ORAL
  Filled 2020-06-08 (×2): qty 2

## 2020-06-08 MED ORDER — ZOLPIDEM TARTRATE 5 MG PO TABS: 5.0000 mg | ORAL_TABLET | Freq: Every evening | ORAL | Status: DC | PRN

## 2020-06-08 NOTE — Progress Notes (Signed)
Assessment & Plan: POD#1 - status post lap chole with IOC, drain placement - 06/07/2020 Dr. Jimmey Ralph clear liquid diet - advance to regular today  Monitor drain output - continue drain for now  LFT's improving  OOB, ambulate in halls with assist  Per medical service        Margaret Level, MD       [facility]       Office: [phone]   Chief Complaint: Acute emphysematous cholecystitis  Subjective: Patient in bed, some pain.  Tolerating clear liquid diet.  Objective: Vital signs in last 24 hours: Temp:  [97.6 F (36.4 C)-99 F (37.2 C)] 97.8 F (36.6 C) (08/08 0713) Pulse Rate:  [78-91] 78 (08/08 0713) Resp:  [12-23] 18 (08/08 0713) BP: (145-172)/(73-91) 145/81 (08/08 0713) SpO2:  [89 %-95 %] 92 % (08/08 0713) Last BM Date: 06/03/20  Intake/Output from previous day: 08/07 0701 - 08/08 0700 In: 50 [I.V.:50] Out: 690 [Urine:550; Drains:115; Blood:25] Intake/Output this shift: No intake/output data recorded.  Physical Exam: HEENT - sclerae clear, mucous membranes moist Neck - soft Chest - clear bilaterally Cor - RRR Abdomen - soft, slight distension; wounds dry and intact; drain with thin dark output Ext - no edema, non-tender Neuro - alert & oriented, no focal deficits  Lab Results:  Recent Labs    06/07/20 0603 06/08/20 0604  WBC 11.2* 14.5*  HGB 11.6* 11.6*  HCT 35.0* 34.7*  PLT 315 322   BMET Recent Labs    06/07/20 0603 06/08/20 0604  NA 136 136  K 3.3* 4.0  CL 101 102  CO2 24 25  GLUCOSE 89 119*  BUN 10 10  CREATININE 0.53 0.60  CALCIUM 8.2* 8.5*   PT/INR Recent Labs    06/06/20 0524  LABPROT 13.6  INR 1.1   Comprehensive Metabolic Panel:    Component Value Date/Time   NA 136 06/08/2020 0604   NA 136 06/07/2020 0603   K 4.0 06/08/2020 0604   K 3.3 (L) 06/07/2020 0603   CL 102 06/08/2020 0604   CL 101 06/07/2020 0603   CO2 25 06/08/2020 0604   CO2 24 06/07/2020 0603   BUN 10 06/08/2020 0604    BUN 10 06/07/2020 0603   CREATININE 0.60 06/08/2020 0604   CREATININE 0.53 06/07/2020 0603   GLUCOSE 119 (H) 06/08/2020 0604   GLUCOSE 89 06/07/2020 0603   CALCIUM 8.5 (L) 06/08/2020 0604   CALCIUM 8.2 (L) 06/07/2020 0603   AST 28 06/08/2020 0604   AST 64 (H) 06/07/2020 0603   ALT 75 (H) 06/08/2020 0604   ALT 107 (H) 06/07/2020 0603   ALKPHOS 155 (H) 06/08/2020 0604   ALKPHOS 193 (H) 06/07/2020 0603   BILITOT 1.1 06/08/2020 0604   BILITOT 1.7 (H) 06/07/2020 0603   PROT 5.8 (L) 06/08/2020 0604   PROT 5.8 (L) 06/07/2020 0603   ALBUMIN 2.3 (L) 06/08/2020 0604   ALBUMIN 2.3 (L) 06/07/2020 0603    Studies/Results: DG Cholangiogram Operative  Result Date: 06/07/2020 CLINICAL DATA:  Cholecystectomy for emphysematous cholecystitis. EXAM: INTRAOPERATIVE CHOLANGIOGRAM TECHNIQUE: Cholangiographic images from the C-arm fluoroscopic device were submitted for interpretation post-operatively. Please see the procedural report for the amount of contrast and the fluoroscopy time utilized. COMPARISON:  MRI of the abdomen on 06/06/2020 FINDINGS: Intraoperative imaging demonstrates a patent cystic duct. The common hepatic and common bile ducts are patent and of normal caliber without evidence of filling defect. Contrast enters the duodenum. IMPRESSION: Unremarkable intraoperative cholangiogram.  Electronically Signed   By: Irish LackGlenn  Yamagata M.D.   On: 06/07/2020 13:10   MR 3D Recon At Scanner  Addendum Date: 06/06/2020   ADDENDUM REPORT: 06/06/2020 21:31 ADDENDUM: These results were called by telephone on 06/06/2020 at 9:31 pm to provider Dr Toniann FailKakrakandy, who verbally acknowledged these results. Electronically Signed   By: Kreg ShropshirePrice  DeHay M.D.   On: 06/06/2020 21:31   Result Date: 06/06/2020 CLINICAL DATA:  Right upper quadrant pain, nondiagnostic ultrasound, elevated LFTs EXAM: MRI ABDOMEN WITHOUT AND WITH CONTRAST (INCLUDING MRCP) TECHNIQUE: Multiplanar multisequence MR imaging of the abdomen was performed both before  and after the administration of intravenous contrast. Heavily T2-weighted images of the biliary and pancreatic ducts were obtained, and three-dimensional MRCP images were rendered by post processing. CONTRAST:  7.615mL GADAVIST GADOBUTROL 1 MMOL/ML IV SOLN COMPARISON:  CT 06/02/2020, CT angio chest 06/05/2020 FINDINGS: Lower chest: There are small bilateral effusions, right slightly greater than left with adjacent areas of atelectatic lung which appear fairly uniform in signal intensity though underlying infection is not fully excluded. Cardiac size upper limits normal Hepatobiliary: No focal liver lesions are evident. The liver surface is smooth. Normal intrinsic liver signal without significant dropout on out of phase imaging to suggest fatty infiltration. There is however some mild hyperemic enhancement adjacent the gallbladder fossa which is likely reactive to the gallbladder inflammation. Additional mildly increased T2 signal about the biliary tree likely reflecting some periportal edema or inflammation. The gallbladder itself is markedly distended with a mixture of tumefactive sludge and more normal bile which is best seen on the source images and does extend into the normally tapering distal bile duct. There is diffuse gallbladder wall thickening and significant enhancement as well as bubbly foci T2 signal dropout towards the gallbladder fundus and within the gallbladder fossa concerning for emphysematous cholecystitis. Extensive surrounding T2 hyperintense inflammatory changes are present. Pancreas: Mild pancreatic atrophy. No visible enhancing or hypoenhancing lesions within the pancreas. No pancreatic ductal dilatation. Spleen: Normal in size. No concerning splenic lesions. Foci of signal dropout towards the splenic hilum compatible with calcifications seen on comparison is. Including a larger focus likely reflecting the suspected splenic artery aneurysm. Adrenals/Urinary Tract: Mild thickening of the left  adrenal gland without discernible worrisome nodular mass compatible with adrenal hyperplasia. Mild bilateral perinephric stranding, nonspecific. Bilateral extrarenal pelves as well as several parapelvic renal cysts. No concerning renal lesions. Stomach/Bowel: Some likely reactive edematous changes about the hepatic flexure which closely abuts the distended tip of the gallbladder. Vascular/Lymphatic: Calcifications towards the splenic hilum compatible with splenic artery aneurysm seen on comparison CT. No other major vascular abnormalities. No worrisome adenopathy. Other: Inflammatory changes about the gallbladder and right upper quadrant. Mild body wall edema. Emphysematous changes appear confined to the bladder wall without evidence of free air. Musculoskeletal: No concerning marrow signal abnormalities or abnormal cord signal. Degenerative changes are present throughout the spine without convincing acute osseous injury. These include some Modic type endplate changes multiple levels of spine and anterior thoracic disc spaces. IMPRESSION: 1. Gallbladder demonstrating features of acute emphysematous cholecystitis and containing a large volume of tumefactive sludge extending into the proximal cystic duct but without distal common bile duct stone. Extensive surrounding inflammatory changes and hyperemic hyperenhancement of the adjacent liver parenchyma along the gallbladder fossa. Some mild adjacent inflammatory changes upon the hepatic flexure as well. 2. There is diffusely increased T2 signal about the biliary tree which could reflect some periportal edema or inflammation/cholangitis. Correlate with clinical findings and laboratory values.  3. Small bilateral effusions, right slightly greater than left with adjacent areas of atelectatic lung. Underlying infection is not fully excluded. Currently attempting to contact the ordering provider with a critical value result. Addendum will be submitted upon case discussion.  Electronically Signed: By: Kreg Shropshire M.D. On: 06/06/2020 21:20   MR ABDOMEN MRCP W WO CONTAST  Addendum Date: 06/06/2020   ADDENDUM REPORT: 06/06/2020 21:31 ADDENDUM: These results were called by telephone on 06/06/2020 at 9:31 pm to provider Dr Toniann Fail, who verbally acknowledged these results. Electronically Signed   By: Kreg Shropshire M.D.   On: 06/06/2020 21:31   Result Date: 06/06/2020 CLINICAL DATA:  Right upper quadrant pain, nondiagnostic ultrasound, elevated LFTs EXAM: MRI ABDOMEN WITHOUT AND WITH CONTRAST (INCLUDING MRCP) TECHNIQUE: Multiplanar multisequence MR imaging of the abdomen was performed both before and after the administration of intravenous contrast. Heavily T2-weighted images of the biliary and pancreatic ducts were obtained, and three-dimensional MRCP images were rendered by post processing. CONTRAST:  7.86mL GADAVIST GADOBUTROL 1 MMOL/ML IV SOLN COMPARISON:  CT 06/02/2020, CT angio chest 06/05/2020 FINDINGS: Lower chest: There are small bilateral effusions, right slightly greater than left with adjacent areas of atelectatic lung which appear fairly uniform in signal intensity though underlying infection is not fully excluded. Cardiac size upper limits normal Hepatobiliary: No focal liver lesions are evident. The liver surface is smooth. Normal intrinsic liver signal without significant dropout on out of phase imaging to suggest fatty infiltration. There is however some mild hyperemic enhancement adjacent the gallbladder fossa which is likely reactive to the gallbladder inflammation. Additional mildly increased T2 signal about the biliary tree likely reflecting some periportal edema or inflammation. The gallbladder itself is markedly distended with a mixture of tumefactive sludge and more normal bile which is best seen on the source images and does extend into the normally tapering distal bile duct. There is diffuse gallbladder wall thickening and significant enhancement as well as bubbly  foci T2 signal dropout towards the gallbladder fundus and within the gallbladder fossa concerning for emphysematous cholecystitis. Extensive surrounding T2 hyperintense inflammatory changes are present. Pancreas: Mild pancreatic atrophy. No visible enhancing or hypoenhancing lesions within the pancreas. No pancreatic ductal dilatation. Spleen: Normal in size. No concerning splenic lesions. Foci of signal dropout towards the splenic hilum compatible with calcifications seen on comparison is. Including a larger focus likely reflecting the suspected splenic artery aneurysm. Adrenals/Urinary Tract: Mild thickening of the left adrenal gland without discernible worrisome nodular mass compatible with adrenal hyperplasia. Mild bilateral perinephric stranding, nonspecific. Bilateral extrarenal pelves as well as several parapelvic renal cysts. No concerning renal lesions. Stomach/Bowel: Some likely reactive edematous changes about the hepatic flexure which closely abuts the distended tip of the gallbladder. Vascular/Lymphatic: Calcifications towards the splenic hilum compatible with splenic artery aneurysm seen on comparison CT. No other major vascular abnormalities. No worrisome adenopathy. Other: Inflammatory changes about the gallbladder and right upper quadrant. Mild body wall edema. Emphysematous changes appear confined to the bladder wall without evidence of free air. Musculoskeletal: No concerning marrow signal abnormalities or abnormal cord signal. Degenerative changes are present throughout the spine without convincing acute osseous injury. These include some Modic type endplate changes multiple levels of spine and anterior thoracic disc spaces. IMPRESSION: 1. Gallbladder demonstrating features of acute emphysematous cholecystitis and containing a large volume of tumefactive sludge extending into the proximal cystic duct but without distal common bile duct stone. Extensive surrounding inflammatory changes and  hyperemic hyperenhancement of the adjacent liver parenchyma along the gallbladder  fossa. Some mild adjacent inflammatory changes upon the hepatic flexure as well. 2. There is diffusely increased T2 signal about the biliary tree which could reflect some periportal edema or inflammation/cholangitis. Correlate with clinical findings and laboratory values. 3. Small bilateral effusions, right slightly greater than left with adjacent areas of atelectatic lung. Underlying infection is not fully excluded. Currently attempting to contact the ordering provider with a critical value result. Addendum will be submitted upon case discussion. Electronically Signed: By: Kreg Shropshire M.D. On: 06/06/2020 21:20      Margaret Cross 06/08/2020  Patient ID: Margaret Cross, female   DOB: July 07, 1941, 79 y.o.   MRN: [id number]

## 2020-06-08 NOTE — Progress Notes (Signed)
Physical Therapy Treatment Patient Details Name: Margaret Cross MRN: [id number] DOB: 05-May-1941 Today's Date: 06/08/2020    History of Present Illness 79 y.o. female with a past medical history of anxiety disorder, essential hypertension. pt admitted with c/o R UQ pain,  fatigue, N/V, dx with CAP/hypoxia, transaminitis, Concern for gallbladder disease versus biliary disease    PT Comments    Pt pleasant and cooperative, progressing with gait distance and activity tolerance. Pt reports she has not amb today, did walk last night with NT using RW.  amb without RW during PT session today, mild unsteadiness which improved with distance. Amb on RA, Sats 90-93%, 2/5 DOE. O2 replaced, at rest. Encouraged use of IS.   Continue to follow in acute setting.  Follow Up Recommendations  No PT follow up     Equipment Recommendations  None recommended by PT    Recommendations for Other Services       Precautions / Restrictions Precautions Precautions: Fall Precaution Comments: monitor O2    Mobility  Bed Mobility Overal bed mobility: Needs Assistance Bed Mobility: Rolling;Sidelying to Sit;Sit to Sidelying Rolling: Modified independent (Device/Increase time) Sidelying to sit: Min guard     Sit to sidelying: Min assist General bed mobility comments: use of rail, min/guard to elevate trunk, assist to bring LEs on to bed d/t pain   Transfers Overall transfer level: Needs assistance Equipment used: None Transfers: Sit to/from Stand Sit to Stand: Supervision;Min guard         General transfer comment: for safety from bed   Ambulation/Gait Ambulation/Gait assistance: Min guard;Min assist Gait Distance (Feet): 160 Feet Assistive device: None;1 person hand held assist Gait Pattern/deviations: Step-through pattern;Drifts right/left     General Gait Details: unsteady initially, improved with distance, incr time with turns d/t abd pain   Stairs             Wheelchair Mobility     Modified Rankin (Stroke Patients Only)       Balance     Sitting balance-Leahy Scale: Good       Standing balance-Leahy Scale: Fair                              Cognition Arousal/Alertness: Awake/alert Behavior During Therapy: WFL for tasks assessed/performed Overall Cognitive Status: Within Functional Limits for tasks assessed                                 General Comments: very plesant and cooperative      Exercises      General Comments        Pertinent Vitals/Pain Pain Assessment: Faces Faces Pain Scale: Hurts even more Pain Location: abdomen Pain Descriptors / Indicators: Guarding;Sore Pain Intervention(s): Limited activity within patient's tolerance;Monitored during session;Premedicated before session    Home Living                      Prior Function            PT Goals (current goals can now be found in the care plan section) Acute Rehab PT Goals Patient Stated Goal: less pain, be able to eat and drink PT Goal Formulation: With patient Time For Goal Achievement: 06/20/20 Potential to Achieve Goals: Good Progress towards PT goals: Progressing toward goals    Frequency    Min 3X/week      PT Plan Current  plan remains appropriate    Co-evaluation              AM-PAC PT "6 Clicks" Mobility   Outcome Measure  Help needed turning from your back to your side while in a flat bed without using bedrails?: None Help needed moving from lying on your back to sitting on the side of a flat bed without using bedrails?: A Little Help needed moving to and from a bed to a chair (including a wheelchair)?: A Little Help needed standing up from a chair using your arms (e.g., wheelchair or bedside chair)?: A Little Help needed to walk in hospital room?: A Little Help needed climbing 3-5 steps with a railing? : A Little 6 Click Score: 19    End of Session   Activity Tolerance: Patient tolerated treatment  well Patient left: in bed;with call bell/phone within reach;with family/visitor present   PT Visit Diagnosis: Other abnormalities of gait and mobility (R26.89)     Time: 1610-9604 PT Time Calculation (min) (ACUTE ONLY): 19 min  Charges:  $Gait Training: 8-22 mins                     Delice Bison, PT  Acute Rehab Dept ([facility]) [phone] Pager [phone]  06/08/2020    [facility] 06/08/2020, 3:20 PM

## 2020-06-08 NOTE — Progress Notes (Signed)
PROGRESS NOTE    Margaret Cross  NWG:[id number] DOB: Aug 03, 1941 DODriscilla Grammes: 06/05/2020 PCP: Harold BarbanPearson, Darren J, MD    Chief Complaint  Patient presents with  . Abdominal Pain    Brief Narrative:  HPI per Dr. Jacqulyn CaneKrishnan [name] is a 79 y.o. female with a past medical history of anxiety disorder, essential hypertension who was in her usual state of health till about 2 weeks ago when she noticed that she had lost her appetite.  She felt fatigued.   She also had bouts of nausea and vomiting about a week ago. And then about 3 days prior to this admission patient started developing abdominal pain in the right upper quadrant along with some nausea.    She went to the emergency department and was evaluated and was noted to have abnormal LFTs.  A CT abdomen raised concern for inflammation in the gallbladder.  Abdominal ultrasound however did not show any acute cholecystitis.  Patient was discharged home.  She came back to the emergency department today due to persistent symptoms.  She has also developed some shortness of breath. She has chronic acid reflux and has a chronic cough as a result of the same. Her cough really has not changed much.  Evaluation done today showed improvement in the LFTs.  A CT angiogram was done which did not show any PE but raised concern for possible infectious process in the lung.  It was felt that perhaps this was causing her abdominal symptoms.  Patient currently denies any chest pain.  She continues to have pain in the right upper abdomen which she rates at 6 out of 10 in intensity without any radiation.  No precipitating aggravating or relieving factors.  She did have fever last week as well.  She has noticed that she tends to get a little short of breath when she talks a lot.  Denies any diarrhea.  See above for ED course.  Assessment & Plan:   Principal Problem:   Acute emphysematous cholecystitis Active Problems:   Transaminitis   Acute respiratory failure with  hypoxia (HCC)   CAP (community acquired pneumonia)   RUQ pain   Essential hypertension   Anxiety   Hyperglycemia   Hypokalemia  1 acute emphysematous cholecystitis  Concern for gallbladder disease versus biliary disease initially on admission as patient had presented with right upper quadrant abdominal pain with a transaminitis..  Patient with a prior 2-week history of nausea decreased appetite, emesis, right upper quadrant abdominal pain noted to have elevated transaminitis.  CT abdomen done 06/02/2020 on presentation to the ED, did suggest some inflammation in the gallbladder however right upper quadrant ultrasound done a few days ago was negative for acute cholecystitis.  Patient with ongoing right upper quadrant pain.  Patient also noted to have a low-grade fever.  Patient on admission with a leukocytosis, worsening transaminitis. HIDA scan ordered however unable to be done on 06/06/2020 due to lack of tracer.  Acute hepatitis panel done negative.  GI was consulted and patient seen in consultation by Dr. Christella HartiganJacobs who recommended/ordered MRCP done the evening of 06/06/2020, which was concerning for acute emphysematous cholecystitis.  General surgery consulted and patient seen in consultation by Dr.Gerkin who assessed the patient and patient underwent laparoscopic cholecystectomy with intraoperative cholangiogram 06/07/2020 without any complications.  Patient tolerated clear liquid diet.  Diet has been advanced to a regular diet per general surgery.  Continue current regimen of IV Rocephin, IV Flagyl.  General surgery following and appreciate input and recommendations.  GI was following but have signed off as of 06/07/2020.  2.  Acute respiratory failure with hypoxia/??  Community-acquired pneumonia Patient denies any cough.  Patient however with nausea and vomiting and likely could have aspirated.  CT angiogram chest done for pulmonary embolus, bilateral lower lobe airspace opacities R > L which could reflect  atelectasis or pneumonia.  Trace right pleural effusion.  MRCP with small bilateral effusions noted R > L.  SARS coronavirus 2 PCR negative.  Blood cultures pending.  Saline lock IV fluids today.  Continue empiric IV Rocephin, IV Flagyl, azithromycin.  Supportive care.  Follow.    3.  Hypertension Continue Cozaar.  IV Lopressor.  Likely transition back to home dose Norvasc in the next 24 hours and discontinue IV Lopressor.  Follow.    4.  Dehydration DC IV fluids today.  Follow.    5.  Hyperglycemia Patient with no prior history of diabetes.  Hemoglobin A1c 6.3.  Blood glucose level on basic metabolic profile this morning at 119.  Will need outpatient follow-up.  6.  Gastroesophageal reflux disease Continue IV PPI.  Transition to oral PPI when patient has been advanced to oral diet postoperatively.  Follow for now.  7.  Anxiety disorder Celexa decreased to 20 mg daily.  Resume Elavil.  Follow.   8.  Hyperlipidemia Continue to hold statin.  Likely resume statin on discharge.  9.  Hypokalemia Repleted.  Potassium at 4.0.  10.  Insomnia Resume home regimen Elavil.   DVT prophylaxis: SCDs.   Code Status: Full Family Communication: Updated patient.  No family at bedside. Disposition:   Status is: Inpatient    Dispo: The patient is from: Home              Anticipated d/c is to: Likely home              Anticipated d/c date is: 1 to 2 days              Patient with acute emphysematous cholecystitis status post laparoscopic cholecystectomy per general surgery.  Diet being advanced today.  Not stable for discharge.         Consultants:   Gastroenterology: Dr. Christella Hartigan 06/06/2020  General surgery: Dr.Gerkin 06/07/2020  Procedures:   CT angio 06/05/2020  Chest x-ray 06/05/2020  MRCP 06/06/2020  Laparoscopic cholecystectomy with intraoperative cholangiography per Dr. Gerrit Friends 06/07/2020  Antimicrobials:   IV Rocephin 06/05/2020>>>>  IV Flagyl 06/05/2020>>>>>  IV azithromycin  06/05/2020>>>>   Subjective: Patient sitting up in chair.  Denies any chest pain or shortness of breath.  Stated ate some eggs and toast this morning without any worsening abdominal pain.  Complains of right upper quadrant abdominal pain around drain site however significantly improved from pain on admission.  Patient stated had difficulty sleeping last night.  Objective: Vitals:   06/07/20 1438 06/07/20 1509 06/07/20 2109 06/08/20 0713  BP: (!) 145/75 (!) 150/91 (!) 172/79 (!) 145/81  Pulse: 89 84 78 78  Resp: Temp: 97.6 F (36.4 C) 98 F (36.7 C) 98 F (36.7 C) 97.8 F (36.6 C)  TempSrc:  Oral Oral Oral  SpO2: 94% 91% 92% 92%  Weight:      Height:        Intake/Output Summary (Last 24 hours) at 06/08/2020 1003 Last data filed at 06/08/2020 0749 Gross per 24 hour  Intake 50 ml  Output 920 ml  Net -870 ml   Filed Weights   06/05/20 1610  Weight: 90.2 kg    Examination:  General exam: NAD. Respiratory system: Decreased breath sounds in the bases.  No wheezing.  Normal respiratory effort.  Speaking in full sentences. Cardiovascular system: RRR no murmurs rubs or gallops.  No JVD.  No lower extremity edema.   Gastrointestinal system: Abdomen is soft, nondistended, some tender to palpation in the right upper quadrant around drain site.  Positive bowel sounds.  No rebound.  No guarding.  Right-sided drain with serosanguineous fluid.  Central nervous system: Alert and oriented. No focal neurological deficits. Extremities: Symmetric 5 x 5 power. Skin: No rashes, lesions or ulcers Psychiatry: Judgement and insight appear normal. Mood & affect appropriate.     Data Reviewed: I have personally reviewed following labs and imaging studies  CBC: Recent Labs  Lab 06/02/20 0939 06/05/20 0901 06/06/20 0524 06/07/20 0603 06/08/20 0604  WBC 8.7 21.0* 14.1* 11.2* 14.5*  NEUTROABS 7.1 18.9*  --  9.4* 12.1*  HGB 14.9 13.5 11.7* 11.6* 11.6*  HCT 44.0 40.2 34.8* 35.0*  34.7*  MCV 94.6 95.9 96.9 98.6 97.5  PLT 150 206 257 315 322    Basic Metabolic Panel: Recent Labs  Lab 06/02/20 1159 06/05/20 0901 06/06/20 0524 06/07/20 0603 06/08/20 0604  NA 137 132* 134* 136 136  K 3.3* 3.7 3.7 3.3* 4.0  CL 101 97* 102 101 102  CO2 24 23 23 24 25   GLUCOSE 155* 200* 136* 89 119*  BUN 8 21 13 10 10   CREATININE 0.49 1.13* 0.58 0.53 0.60  CALCIUM 8.8* 9.6 8.2* 8.2* 8.5*  MG  --   --   --  2.1  --     GFR: Estimated Creatinine Clearance: 64.6 mL/min (by C-G formula based on SCr of 0.6 mg/dL).  Liver Function Tests: Recent Labs  Lab 06/02/20 1159 06/05/20 0901 06/06/20 0524 06/07/20 0603 06/08/20 0604  AST 76* 21 135* 64* 28  ALT 234* 75* 134* 107* 75*  ALKPHOS 152* 103 223* 193* 155*  BILITOT 1.6* 1.2 2.8* 1.7* 1.1  PROT 7.5 6.9 6.4* 5.8* 5.8*  ALBUMIN 3.5 3.4* 2.4* 2.3* 2.3*    CBG: No results for input(s): GLUCAP in the last 168 hours.   Recent Results (from the past 240 hour(s))  SARS Coronavirus 2 by RT PCR (hospital order, performed in [facility] hospital lab) Nasopharyngeal Nasopharyngeal Swab     Status: None   Collection Time: 06/02/20 10:02 AM   Specimen: Nasopharyngeal Swab  Result Value Ref Range Status   SARS Coronavirus 2 NEGATIVE NEGATIVE Final    Comment: (NOTE) SARS-CoV-2 target nucleic acids are NOT DETECTED.  The SARS-CoV-2 RNA is generally detectable in upper and lower respiratory specimens during the acute phase of infection. The lowest concentration of SARS-CoV-2 viral copies this assay can detect is 250 copies / mL. A negative result does not preclude SARS-CoV-2 infection and should not be used as the sole basis for treatment or other patient management decisions.  A negative result may occur with improper specimen collection / handling, submission of specimen other than nasopharyngeal swab, presence of viral mutation(s) within the areas targeted by this assay, and inadequate number of viral copies (<250 copies /  mL). A negative result must be combined with clinical observations, patient history, and epidemiological information.  Fact Sheet for Patients:   [facility]  Fact Sheet for Healthcare Providers: 08/02/20  This test is not yet approved or  cleared by the [employer] FDA and has been authorized for detection and/or diagnosis of SARS-CoV-2  by FDA under an Emergency Use Authorization (EUA).  This EUA will remain in effect (meaning this test can be used) for the duration of the COVID-19 declaration under Section 564(b)(1) of the Act, 21 U.S.C. section 360bbb-3(b)(1), unless the authorization is terminated or revoked sooner.  Performed at [facility], [address] [place], [place] [address]   Culture, blood (routine x 2)     Status: None (Preliminary result)   Collection Time: 06/05/20 10:00 AM   Specimen: BLOOD  Result Value Ref Range Status   Specimen Description   Final    BLOOD LEFT ANTECUBITAL Performed at [facility], [address] [place], [place] [address]    Special Requests   Final    BOTTLES DRAWN AEROBIC AND ANAEROBIC Blood Culture adequate volume Performed at [facility], [address] [place], [place] [address]    Culture   Final    NO GROWTH 2 DAYS Performed at [facility], [address] [place], [place] [address]    Report Status PENDING  Incomplete  SARS Coronavirus 2 by RT PCR (hospital order, performed in [facility] hospital lab) Nasopharyngeal Nasopharyngeal Swab     Status: None   Collection Time: 06/05/20 10:21 AM   Specimen: Nasopharyngeal Swab  Result Value Ref Range Status   SARS Coronavirus 2 NEGATIVE NEGATIVE Final    Comment: (NOTE) SARS-CoV-2 target nucleic acids are NOT DETECTED.  The SARS-CoV-2 RNA is generally detectable in upper and lower respiratory specimens during the acute phase of infection. The lowest concentration of  SARS-CoV-2 viral copies this assay can detect is 250 copies / mL. A negative result does not preclude SARS-CoV-2 infection and should not be used as the sole basis for treatment or other patient management decisions.  A negative result may occur with improper specimen collection / handling, submission of specimen other than nasopharyngeal swab, presence of viral mutation(s) within the areas targeted by this assay, and inadequate number of viral copies (<250 copies / mL). A negative result must be combined with clinical observations, patient history, and epidemiological information.  Fact Sheet for Patients:   [employer]  Fact Sheet for Healthcare Providers: https://pope.com/  This test is not yet approved or  cleared by the [place] FDA and has been authorized for detection and/or diagnosis of SARS-CoV-2 by FDA under an Emergency Use Authorization (EUA).  This EUA will remain in effect (meaning this test can be used) for the duration of the COVID-19 declaration under Section 564(b)(1) of the Act, 21 U.S.C. section 360bbb-3(b)(1), unless the authorization is terminated or revoked sooner.  Performed at [facility], [address] [place], [place] [address]   Culture, blood (routine x 2)     Status: None (Preliminary result)   Collection Time: 06/05/20 10:30 AM   Specimen: BLOOD  Result Value Ref Range Status   Specimen Description   Final    BLOOD BLOOD LEFT HAND Performed at [facility], [address] [place], [place] [address]    Special Requests   Final    BOTTLES DRAWN AEROBIC AND ANAEROBIC Blood Culture adequate volume Performed at [facility], [address] [place], [place] [address]    Culture   Final    NO GROWTH 2 DAYS Performed at [facility], [address], [place], [place] [address]    Report Status PENDING  Incomplete  Radiology Studies: DG  Cholangiogram Operative  Result Date: 06/07/2020 CLINICAL DATA:  Cholecystectomy for emphysematous cholecystitis. EXAM: INTRAOPERATIVE CHOLANGIOGRAM TECHNIQUE: Cholangiographic images from the C-arm fluoroscopic device were submitted for interpretation post-operatively. Please see the procedural report for the amount of contrast and the fluoroscopy time utilized. COMPARISON:  MRI of the abdomen on 06/06/2020 FINDINGS: Intraoperative imaging demonstrates a patent cystic duct. The common hepatic and common bile ducts are patent and of normal caliber without evidence of filling defect. Contrast enters the duodenum. IMPRESSION: Unremarkable intraoperative cholangiogram. Electronically Signed   By: Irish Lack M.D.   On: 06/07/2020 13:10   MR 3D Recon At Scanner  Addendum Date: 06/06/2020   ADDENDUM REPORT: 06/06/2020 21:31 ADDENDUM: These results were called by telephone on 06/06/2020 at 9:31 pm to provider Dr Toniann Fail, who verbally acknowledged these results. Electronically Signed   By: Kreg Shropshire M.D.   On: 06/06/2020 21:31   Result Date: 06/06/2020 CLINICAL DATA:  Right upper quadrant pain, nondiagnostic ultrasound, elevated LFTs EXAM: MRI ABDOMEN WITHOUT AND WITH CONTRAST (INCLUDING MRCP) TECHNIQUE: Multiplanar multisequence MR imaging of the abdomen was performed both before and after the administration of intravenous contrast. Heavily T2-weighted images of the biliary and pancreatic ducts were obtained, and three-dimensional MRCP images were rendered by post processing. CONTRAST:  7.60mL GADAVIST GADOBUTROL 1 MMOL/ML IV SOLN COMPARISON:  CT 06/02/2020, CT angio chest 06/05/2020 FINDINGS: Lower chest: There are small bilateral effusions, right slightly greater than left with adjacent areas of atelectatic lung which appear fairly uniform in signal intensity though underlying infection is not fully excluded. Cardiac size upper limits normal Hepatobiliary: No focal liver lesions are evident. The liver  surface is smooth. Normal intrinsic liver signal without significant dropout on out of phase imaging to suggest fatty infiltration. There is however some mild hyperemic enhancement adjacent the gallbladder fossa which is likely reactive to the gallbladder inflammation. Additional mildly increased T2 signal about the biliary tree likely reflecting some periportal edema or inflammation. The gallbladder itself is markedly distended with a mixture of tumefactive sludge and more normal bile which is best seen on the source images and does extend into the normally tapering distal bile duct. There is diffuse gallbladder wall thickening and significant enhancement as well as bubbly foci T2 signal dropout towards the gallbladder fundus and within the gallbladder fossa concerning for emphysematous cholecystitis. Extensive surrounding T2 hyperintense inflammatory changes are present. Pancreas: Mild pancreatic atrophy. No visible enhancing or hypoenhancing lesions within the pancreas. No pancreatic ductal dilatation. Spleen: Normal in size. No concerning splenic lesions. Foci of signal dropout towards the splenic hilum compatible with calcifications seen on comparison is. Including a larger focus likely reflecting the suspected splenic artery aneurysm. Adrenals/Urinary Tract: Mild thickening of the left adrenal gland without discernible worrisome nodular mass compatible with adrenal hyperplasia. Mild bilateral perinephric stranding, nonspecific. Bilateral extrarenal pelves as well as several parapelvic renal cysts. No concerning renal lesions. Stomach/Bowel: Some likely reactive edematous changes about the hepatic flexure which closely abuts the distended tip of the gallbladder. Vascular/Lymphatic: Calcifications towards the splenic hilum compatible with splenic artery aneurysm seen on comparison CT. No other major vascular abnormalities. No worrisome adenopathy. Other: Inflammatory changes about the gallbladder and right upper  quadrant. Mild body wall edema. Emphysematous changes appear confined to the bladder wall without evidence of free air. Musculoskeletal: No concerning marrow signal abnormalities or abnormal cord signal. Degenerative changes are present throughout the spine without convincing acute osseous injury. These include some Modic type endplate changes multiple levels  of spine and anterior thoracic disc spaces. IMPRESSION: 1. Gallbladder demonstrating features of acute emphysematous cholecystitis and containing a large volume of tumefactive sludge extending into the proximal cystic duct but without distal common bile duct stone. Extensive surrounding inflammatory changes and hyperemic hyperenhancement of the adjacent liver parenchyma along the gallbladder fossa. Some mild adjacent inflammatory changes upon the hepatic flexure as well. 2. There is diffusely increased T2 signal about the biliary tree which could reflect some periportal edema or inflammation/cholangitis. Correlate with clinical findings and laboratory values. 3. Small bilateral effusions, right slightly greater than left with adjacent areas of atelectatic lung. Underlying infection is not fully excluded. Currently attempting to contact the ordering provider with a critical value result. Addendum will be submitted upon case discussion. Electronically Signed: By: Kreg Shropshire M.D. On: 06/06/2020 21:20   MR ABDOMEN MRCP W WO CONTAST  Addendum Date: 06/06/2020   ADDENDUM REPORT: 06/06/2020 21:31 ADDENDUM: These results were called by telephone on 06/06/2020 at 9:31 pm to provider Dr Toniann Fail, who verbally acknowledged these results. Electronically Signed   By: Kreg Shropshire M.D.   On: 06/06/2020 21:31   Result Date: 06/06/2020 CLINICAL DATA:  Right upper quadrant pain, nondiagnostic ultrasound, elevated LFTs EXAM: MRI ABDOMEN WITHOUT AND WITH CONTRAST (INCLUDING MRCP) TECHNIQUE: Multiplanar multisequence MR imaging of the abdomen was performed both before and  after the administration of intravenous contrast. Heavily T2-weighted images of the biliary and pancreatic ducts were obtained, and three-dimensional MRCP images were rendered by post processing. CONTRAST:  7.25mL GADAVIST GADOBUTROL 1 MMOL/ML IV SOLN COMPARISON:  CT 06/02/2020, CT angio chest 06/05/2020 FINDINGS: Lower chest: There are small bilateral effusions, right slightly greater than left with adjacent areas of atelectatic lung which appear fairly uniform in signal intensity though underlying infection is not fully excluded. Cardiac size upper limits normal Hepatobiliary: No focal liver lesions are evident. The liver surface is smooth. Normal intrinsic liver signal without significant dropout on out of phase imaging to suggest fatty infiltration. There is however some mild hyperemic enhancement adjacent the gallbladder fossa which is likely reactive to the gallbladder inflammation. Additional mildly increased T2 signal about the biliary tree likely reflecting some periportal edema or inflammation. The gallbladder itself is markedly distended with a mixture of tumefactive sludge and more normal bile which is best seen on the source images and does extend into the normally tapering distal bile duct. There is diffuse gallbladder wall thickening and significant enhancement as well as bubbly foci T2 signal dropout towards the gallbladder fundus and within the gallbladder fossa concerning for emphysematous cholecystitis. Extensive surrounding T2 hyperintense inflammatory changes are present. Pancreas: Mild pancreatic atrophy. No visible enhancing or hypoenhancing lesions within the pancreas. No pancreatic ductal dilatation. Spleen: Normal in size. No concerning splenic lesions. Foci of signal dropout towards the splenic hilum compatible with calcifications seen on comparison is. Including a larger focus likely reflecting the suspected splenic artery aneurysm. Adrenals/Urinary Tract: Mild thickening of the left  adrenal gland without discernible worrisome nodular mass compatible with adrenal hyperplasia. Mild bilateral perinephric stranding, nonspecific. Bilateral extrarenal pelves as well as several parapelvic renal cysts. No concerning renal lesions. Stomach/Bowel: Some likely reactive edematous changes about the hepatic flexure which closely abuts the distended tip of the gallbladder. Vascular/Lymphatic: Calcifications towards the splenic hilum compatible with splenic artery aneurysm seen on comparison CT. No other major vascular abnormalities. No worrisome adenopathy. Other: Inflammatory changes about the gallbladder and right upper quadrant. Mild body wall edema. Emphysematous changes appear confined to the bladder  wall without evidence of free air. Musculoskeletal: No concerning marrow signal abnormalities or abnormal cord signal. Degenerative changes are present throughout the spine without convincing acute osseous injury. These include some Modic type endplate changes multiple levels of spine and anterior thoracic disc spaces. IMPRESSION: 1. Gallbladder demonstrating features of acute emphysematous cholecystitis and containing a large volume of tumefactive sludge extending into the proximal cystic duct but without distal common bile duct stone. Extensive surrounding inflammatory changes and hyperemic hyperenhancement of the adjacent liver parenchyma along the gallbladder fossa. Some mild adjacent inflammatory changes upon the hepatic flexure as well. 2. There is diffusely increased T2 signal about the biliary tree which could reflect some periportal edema or inflammation/cholangitis. Correlate with clinical findings and laboratory values. 3. Small bilateral effusions, right slightly greater than left with adjacent areas of atelectatic lung. Underlying infection is not fully excluded. Currently attempting to contact the ordering provider with a critical value result. Addendum will be submitted upon case discussion.  Electronically Signed: By: Kreg Shropshire M.D. On: 06/06/2020 21:20        Scheduled Meds: . azithromycin  500 mg Oral Daily  . citalopram  20 mg Oral Daily  . losartan  50 mg Oral Daily  . metoprolol tartrate  2.5 mg Intravenous Q8H  . metroNIDAZOLE  500 mg Oral Q8H  . pantoprazole (PROTONIX) IV  40 mg Intravenous Daily   Continuous Infusions: . sodium chloride Stopped (06/05/20 1526)  . sodium chloride 50 mL/hr at 06/06/20 2020  . cefTRIAXone (ROCEPHIN)  IV 2 g (06/07/20 0941)     LOS: 3 days    Time spent: 40 minutes    Ramiro Harvest, MD [facility]   To contact the attending provider between 7A-7P or the covering provider during after hours 7P-7A, please log into the web site [email or web address] and access using universal [place] password for that web site. If you do not have the password, please call the hospital operator.  06/08/2020, 10:03 AM

## 2020-06-08 NOTE — Progress Notes (Signed)
Patient using incentive spirometer, reached 1200.

## 2020-06-09 DIAGNOSIS — K5900 Constipation, unspecified: Secondary | ICD-10-CM

## 2020-06-09 LAB — COMPREHENSIVE METABOLIC PANEL
ALT: 56 U/L — ABNORMAL HIGH (ref 0–44)
AST: 22 U/L (ref 15–41)
Albumin: 2.5 g/dL — ABNORMAL LOW (ref 3.5–5.0)
Alkaline Phosphatase: 132 U/L — ABNORMAL HIGH (ref 38–126)
Anion gap: 8 (ref 5–15)
BUN: 9 mg/dL (ref 8–23)
CO2: 28 mmol/L (ref 22–32)
Calcium: 8.2 mg/dL — ABNORMAL LOW (ref 8.9–10.3)
Chloride: 100 mmol/L (ref 98–111)
Creatinine, Ser: 0.51 mg/dL (ref 0.44–1.00)
GFR calc Af Amer: >60 mL/min (ref >60)
GFR calc non Af Amer: >60 mL/min (ref >60)
Glucose, Bld: 113 mg/dL — ABNORMAL HIGH (ref 70–99)
Potassium: 3.5 mmol/L (ref 3.5–5.1)
Sodium: 136 mmol/L (ref 135–145)
Total Bilirubin: 0.9 mg/dL (ref 0.3–1.2)
Total Protein: 5.9 g/dL — ABNORMAL LOW (ref 6.5–8.1)

## 2020-06-09 LAB — CBC WITH DIFFERENTIAL/PLATELET
Abs Immature Granulocytes: 0.15 10*3/uL — ABNORMAL HIGH (ref 0.00–0.07)
Basophils Absolute: 0 10*3/uL (ref 0.0–0.1)
Basophils Relative: 0 %
Eosinophils Absolute: 0.1 10*3/uL (ref 0.0–0.5)
Eosinophils Relative: 1 %
HCT: 36.7 % (ref 36.0–46.0)
Hemoglobin: 12.1 g/dL (ref 12.0–15.0)
Immature Granulocytes: 2 %
Lymphocytes Relative: 13 %
Lymphs Abs: 1.2 10*3/uL (ref 0.7–4.0)
MCH: 32.4 pg (ref 26.0–34.0)
MCHC: 33 g/dL (ref 30.0–36.0)
MCV: 98.1 fL (ref 80.0–100.0)
Monocytes Absolute: 0.9 10*3/uL (ref 0.1–1.0)
Monocytes Relative: 9 %
Neutro Abs: 7.1 10*3/uL (ref 1.7–7.7)
Neutrophils Relative %: 75 %
Platelets: 436 10*3/uL — ABNORMAL HIGH (ref 150–400)
RBC: 3.74 MIL/uL — ABNORMAL LOW (ref 3.87–5.11)
RDW: 13.2 % (ref 11.5–15.5)
WBC: 9.5 10*3/uL (ref 4.0–10.5)
nRBC: 0 % (ref 0.0–0.2)

## 2020-06-09 LAB — ANTI-SMOOTH MUSCLE ANTIBODY, IGG: F-Actin IgG: 8 Units (ref 0–19)

## 2020-06-09 LAB — ANA: Anti Nuclear Antibody (ANA): NEGATIVE

## 2020-06-09 LAB — MAGNESIUM: Magnesium: 2 mg/dL (ref 1.7–2.4)

## 2020-06-09 LAB — MITOCHONDRIAL ANTIBODIES: Mitochondrial M2 Ab, IgG: <20 Units (ref 0.0–20.0)

## 2020-06-09 MED ORDER — BISACODYL 10 MG RE SUPP
10.0000 mg | Freq: Once | RECTAL | Status: AC
  Administered 2020-06-09: 10 mg via RECTAL
  Filled 2020-06-09: qty 1

## 2020-06-09 MED ORDER — PANTOPRAZOLE SODIUM 40 MG PO TBEC: 40.0000 mg | DELAYED_RELEASE_TABLET | Freq: Every day | ORAL | Status: DC

## 2020-06-09 MED ORDER — POTASSIUM CHLORIDE CRYS ER 20 MEQ PO TBCR
40.0000 meq | EXTENDED_RELEASE_TABLET | Freq: Once | ORAL | Status: AC
  Administered 2020-06-09: 40 meq via ORAL
  Filled 2020-06-09: qty 2

## 2020-06-09 MED ORDER — AMLODIPINE BESYLATE 10 MG PO TABS
10.0000 mg | ORAL_TABLET | Freq: Every day | ORAL | Status: DC
  Administered 2020-06-09 – 2020-06-12 (×4): 10 mg via ORAL
  Filled 2020-06-09 (×4): qty 1

## 2020-06-09 MED ORDER — CYCLOSPORINE 0.05 % OP EMUL
1.0000 [drp] | Freq: Two times a day (BID) | OPHTHALMIC | Status: DC
  Administered 2020-06-09 – 2020-06-12 (×7): 1 [drp] via OPHTHALMIC
  Filled 2020-06-09 (×9): qty 1

## 2020-06-09 MED ORDER — IBUPROFEN 200 MG PO TABS
600.0000 mg | ORAL_TABLET | Freq: Four times a day (QID) | ORAL | Status: DC | PRN
  Administered 2020-06-11 – 2020-06-12 (×2): 600 mg via ORAL
  Filled 2020-06-09 (×2): qty 3

## 2020-06-09 MED ORDER — POLYETHYLENE GLYCOL 3350 17 G PO PACK
17.0000 g | PACK | Freq: Two times a day (BID) | ORAL | Status: DC
  Administered 2020-06-09 – 2020-06-11 (×5): 17 g via ORAL
  Filled 2020-06-09 (×6): qty 1

## 2020-06-09 MED ORDER — ACETAMINOPHEN 325 MG PO TABS: ORAL_TABLET | ORAL | Status: AC

## 2020-06-09 MED ORDER — IBUPROFEN 200 MG PO TABS: ORAL_TABLET | ORAL | Status: AC

## 2020-06-09 MED ORDER — OXYCODONE HCL 5 MG PO TABS: ORAL_TABLET | ORAL | 0 refills | Status: AC

## 2020-06-09 MED ORDER — FUROSEMIDE 10 MG/ML IJ SOLN
20.0000 mg | Freq: Once | INTRAMUSCULAR | Status: AC
  Administered 2020-06-09: 20 mg via INTRAVENOUS
  Filled 2020-06-09: qty 2

## 2020-06-09 MED ORDER — POTASSIUM CHLORIDE CRYS ER 20 MEQ PO TBCR: 40.0000 meq | EXTENDED_RELEASE_TABLET | Freq: Once | ORAL | Status: DC

## 2020-06-09 MED ORDER — SORBITOL 70 % SOLN
30.0000 mL | Freq: Once | Status: AC
  Administered 2020-06-09: 30 mL via ORAL
  Filled 2020-06-09: qty 30

## 2020-06-09 MED ORDER — MAGNESIUM CITRATE PO SOLN
1.0000 | Freq: Once | ORAL | Status: AC
  Administered 2020-06-09: 1 via ORAL
  Filled 2020-06-09: qty 296

## 2020-06-09 NOTE — Care Management Important Message (Signed)
Important Message  Patient Details IM Letter given to the Patient Name: Margaret Cross MRN: [id number] Date of Birth: [birth year]   Medicare Important Message Given:  Yes     [name] 06/09/2020, 2:05 PM

## 2020-06-09 NOTE — Progress Notes (Signed)
2 Days Post-Op    CC: Abdominal pain  Subjective: She looks good this morning she still pretty sore.  She is starting a soft diet.  She has been on oxygen, but has an for eating.  She wanted know if she really had pneumonia.  Overall she seems to be doing well from her surgery.  The JP drain shows a dark red serous fluid.  Objective: Vital signs in last 24 hours: Temp:  [97.7 F (36.5 C)-98.5 F (36.9 C)] 98.5 F (36.9 C) (08/09 0531) Pulse Rate:  [82-88] 86 (08/09 0531) Resp:  [16-17] 17 (08/09 0531) BP: (150-169)/(75-90) 158/86 (08/09 0531) SpO2:  [94 %-96 %] 94 % (08/09 0531) Last BM Date: 06/04/20 500 p.o. recorded 450 IV recorded Urine x4 100 from JP drain Afebrile, somewhat hypotensive; last BP 158/86 O2 sats 94% on nasal cannula WBC 9.5 H/H 12.1/36.7 Platelets 436,000 CMP is stable. Alk phos 223>> 132 AST 135>> 22;  T bilirubin 2.8>> 0.9   Intake/Output from previous day: 08/08 0701 - 08/09 0700 In: 950.1 [P.O.:500; I.V.:250; IV Piggyback:200.1] Out: 600 [Urine:500; Drains:100] Intake/Output this shift: Total I/O In: -  Out: 500 [Urine:500]  General appearance: alert, cooperative and no distress Resp: clear to auscultation bilaterally GI: Soft, sore, port sites all look fine.  Lab Results:  Recent Labs    06/08/20 0604 06/09/20 0548  WBC 14.5* 9.5  HGB 11.6* 12.1  HCT 34.7* 36.7  PLT 322 436*    BMET Recent Labs    06/08/20 0604 06/09/20 0548  NA 136 136  K 4.0 3.5  CL 102 100  CO2 25 28  GLUCOSE 119* 113*  BUN 10 9  CREATININE 0.60 0.51  CALCIUM 8.5* 8.2*   PT/INR No results for input(s): LABPROT, INR in the last 72 hours.  Recent Labs  Lab 06/05/20 0901 06/06/20 0524 06/07/20 0603 06/08/20 0604 06/09/20 0548  AST 21 135* 64* 28 22  ALT 75* 134* 107* 75* 56*  ALKPHOS 103 223* 193* 155* 132*  BILITOT 1.2 2.8* 1.7* 1.1 0.9  PROT 6.9 6.4* 5.8* 5.8* 5.9*  ALBUMIN 3.4* 2.4* 2.3* 2.3* 2.5*     Lipase     Component Value  Date/Time   LIPASE 26 06/05/2020 0901     Medications: . amLODipine  10 mg Oral Daily  . azithromycin  500 mg Oral Daily  . citalopram  20 mg Oral Daily  . cycloSPORINE  1 drop Both Eyes BID  . guaiFENesin  1,200 mg Oral BID  . losartan  50 mg Oral Daily  . metroNIDAZOLE  500 mg Oral Q8H  . pantoprazole (PROTONIX) IV  40 mg Intravenous Daily  . potassium chloride  40 mEq Oral Once   . sodium chloride Stopped (06/05/20 1526)  . cefTRIAXone (ROCEPHIN)  IV 2 g (06/07/20 0941)    Assessment/Plan   Acute respiratory failure with hypoxia/possible community-acquired pneumonia Hypertension Dehydration Hyperglycemia GERD Anxiety disorder Hyperlipidemia Hypokalemia  Acute emphysematous colitis Laparoscopic cholecystectomy with intraoperative cholangiogram, 52 French drain placement, 06/07/2020, Dr. Armandina Gemma, POD #2  FEN: Regular diet ID: Rocephin 8/5-06/07/2020;  azithromycin 8/5 >> day 5; Flagyl 8/5>> day 5 DVT: SCDs; she can have DVT chemical prophylaxis from our standpoint. Follow-up: [facility]  Plan: She no longer requires antibiotics from our standpoint.  I have reviewed discharge instructions with her.  I will add ibuprofen.  Will discuss drain removal with Dr. Marcello Moores.    LOS: 4 days    Rosene Pilling 06/09/2020 Please see Amion

## 2020-06-09 NOTE — Progress Notes (Signed)
Occupational Therapy Treatment Patient Details Name: Margaret Cross MRN: [id number] DOB: 1941-01-18 Today's Date: 06/09/2020    History of present illness 79 y.o. female with a past medical history of anxiety disorder, essential hypertension. pt admitted with c/o R UQ pain,  fatigue, N/V, dx with CAP/hypoxia, transaminitis, Concern for gallbladder disease versus biliary disease. Pt s/p Lap chole on 8/7.   OT comments  Patient progressing and showed overall  improved ADLs compared to previous session as evidenced by increased score on 6-Clicks AM-PAC measure of occupational Performance with previous score of 16/24, and current score of 19/24. Patient limited by impaired activity tolerance and mild unsteadiness with functional mobility, along with deficits noted below. Pt continues to demonstrate good rehab potential and would benefit from continued skilled OT to increase safety and independence with ADLs and functional transfers to allow pt to return home safely and reduce caregiver burden and fall risk.   Follow Up Recommendations  No OT follow up    Equipment Recommendations   (Pt may benefit from adaptive equipment package for energy conservation.)    Recommendations for Other Services      Precautions / Restrictions Precautions Precautions: Fall Precaution Comments: monitor O2 Restrictions Weight Bearing Restrictions: No       Mobility Bed Mobility Overal bed mobility: Needs Assistance Bed Mobility: Supine to Sit     Supine to sit: Supervision (verbal cues to minimze use of abdominal mm for pain management. Pt cued to roll to side from now on.)        Transfers   Equipment used: None Transfers: Sit to/from Stand Sit to Stand: Supervision              Balance Overall balance assessment: Needs assistance Sitting-balance support: No upper extremity supported Sitting balance-Leahy Scale: Good       Standing balance-Leahy Scale: Fair Standing balance comment:  Will ocassionally reach for IV pole and hallway rails to steady herself.                           ADL either performed or assessed with clinical judgement   ADL       Grooming: Wash/dry hands;Supervision/safety;Standing;Oral care               Lower Body Dressing: Set up Lower Body Dressing Details (indicate cue type and reason): Pt donned socks while sitting EOB, modifying her mvoements to compensate for incisional pain. Pt educated on adaptive equipment options, and stated that she is familiar with all long handled equipment but has not needed to use. Pt stated that she knows where to find in the community. Toilet Transfer: Supervision/safety;Regular Toilet;Grab bars   Toileting- Architect and Hygiene: Modified independent;Sitting/lateral lean       Functional mobility during ADLs: Supervision/safety General ADL Comments: Pt requested hallway ambulation.  ambulated 75', had standing rest break, then another 25' back to room without AD.  Pt cued in pursed lip breathing as needed during rest breaks. O2 monitored.     Vision   Vision Assessment?: No apparent visual deficits   Perception     Praxis      Cognition Arousal/Alertness: Awake/alert Behavior During Therapy: WFL for tasks assessed/performed Overall Cognitive Status: Within Functional Limits for tasks assessed                                 General Comments: very plesant and  cooperative        Exercises     Shoulder Instructions       General Comments Room air for majority of mobility with OT. SpO2 as low as 90% with effot and up to 94-95% at rest.  Pt placed back on [place] once up in recliner for comfort. RN notified.    Pertinent Vitals/ Pain       Pain Assessment: Faces Faces Pain Scale: Hurts little more Pain Location: abdomen Pain Descriptors / Indicators: Guarding;Sore Pain Intervention(s): Limited activity within patient's tolerance;Monitored during  session;Repositioned  Home Living                                          Prior Functioning/Environment              Frequency  Min 2X/week        Progress Toward Goals  OT Goals(current goals can now be found in the care plan section)  Progress towards OT goals: Progressing toward goals  Acute Rehab OT Goals Patient Stated Goal: Be able to walk and have oxgyen levels stay at acceptable level. OT Goal Formulation: With patient Time For Goal Achievement: 06/20/20 Potential to Achieve Goals: Good  Plan      Co-evaluation                 AM-PAC OT "6 Clicks" Daily Activity     Outcome Measure   Help from another person eating meals?: None Help from another person taking care of personal grooming?: A Little (standing) Help from another person toileting, which includes using toliet, bedpan, or urinal?: A Little Help from another person bathing (including washing, rinsing, drying)?: A Little Help from another person to put on and taking off regular upper body clothing?: A Little Help from another person to put on and taking off regular lower body clothing?: A Little 6 Click Score: 19    End of Session Equipment Utilized During Treatment: Oxygen;Gait belt  Pain - Right/Left: Right   Activity Tolerance Patient tolerated treatment well   Patient Left in chair;with call bell/phone within reach;with chair alarm set   Nurse Communication Mobility status (SpO2)        Time: 4098-1191 OT Time Calculation (min): 29 min  Charges: OT General Charges $OT Visit: 1 Visit OT Treatments $Self Care/Home Management : 8-22 mins $Therapeutic Activity: 8-22 mins  Victorino Dike, OT Acute Rehab Services Office: [phone] 06/09/2020    Theodoro Clock 06/09/2020, 2:15 PM

## 2020-06-09 NOTE — Progress Notes (Signed)
PROGRESS NOTE    Margaret Cross  OFB:[id number] DOB: [birth year] DOA: 06/05/2020 PCP: Harold Barban, MD    Chief Complaint  Patient presents with  . Abdominal Pain    Brief Narrative:  HPI per [name] is a 79 y.o. female with a past medical history of anxiety disorder, essential hypertension who was in her usual state of health till about 2 weeks ago when she noticed that she had lost her appetite.  She felt fatigued.   She also had bouts of nausea and vomiting about a week ago. And then about 3 days prior to this admission patient started developing abdominal pain in the right upper quadrant along with some nausea.    She went to the emergency department and was evaluated and was noted to have abnormal LFTs.  A CT abdomen raised concern for inflammation in the gallbladder.  Abdominal ultrasound however did not show any acute cholecystitis.  Patient was discharged home.  She came back to the emergency department today due to persistent symptoms.  She has also developed some shortness of breath. She has chronic acid reflux and has a chronic cough as a result of the same. Her cough really has not changed much.  Evaluation done today showed improvement in the LFTs.  A CT angiogram was done which did not show any PE but raised concern for possible infectious process in the lung.  It was felt that perhaps this was causing her abdominal symptoms.  Patient currently denies any chest pain.  She continues to have pain in the right upper abdomen which she rates at 6 out of 10 in intensity without any radiation.  No precipitating aggravating or relieving factors.  She did have fever last week as well.  She has noticed that she tends to get a little short of breath when she talks a lot.  Denies any diarrhea.  See above for ED course.  Assessment & Plan:   Principal Problem:   Acute emphysematous cholecystitis Active Problems:   Transaminitis   Acute respiratory failure with  hypoxia (HCC)   CAP (community acquired pneumonia)   RUQ pain   Essential hypertension   Anxiety   Hyperglycemia   Hypokalemia   Constipation  1 acute emphysematous cholecystitis  Concern for gallbladder disease versus biliary disease initially on admission as patient had presented with right upper quadrant abdominal pain with a transaminitis..  Patient with a prior 2-week history of nausea decreased appetite, emesis, right upper quadrant abdominal pain noted to have elevated transaminitis.  CT abdomen done 06/02/2020 on presentation to the ED, did suggest some inflammation in the gallbladder however right upper quadrant ultrasound done a few days ago was negative for acute cholecystitis.  Patient with ongoing right upper quadrant pain.  Patient also noted to have a low-grade fever.  Patient on admission with a leukocytosis, worsening transaminitis. HIDA scan ordered however unable to be done on 06/06/2020 due to lack of tracer.  Acute hepatitis panel done negative.  GI was consulted and patient seen in consultation by Dr. Christella Hartigan who recommended/ordered MRCP done the evening of 06/06/2020, which was concerning for acute emphysematous cholecystitis.  General surgery consulted and patient seen in consultation by Dr.Gerkin who assessed the patient and patient underwent laparoscopic cholecystectomy with intraoperative cholangiogram 06/07/2020 without any complications.  Patient tolerating regular diet.  Continue empiric IV antibiotics for another 24 hours.  General surgery following and I appreciate input and recommendations.  GI was following but signed off as of  06/07/2020.   2.  Acute respiratory failure with hypoxia/??  Community-acquired pneumonia Patient denies any cough.  Patient however with nausea and vomiting and likely could have aspirated.  CT angiogram chest done for pulmonary embolus, bilateral lower lobe airspace opacities R > L which could reflect atelectasis or pneumonia.  Trace right pleural  effusion.  MRCP with small bilateral effusions noted R > L.  SARS coronavirus 2 PCR negative.  Blood cultures pending with no growth to date IV fluids have been saline lock.  Continue IV Rocephin, IV Flagyl, azithromycin.  Could likely discontinue antibiotics tomorrow.  We will give Lasix 20 mg IV x1.  Supportive care..   3.  Hypertension Continue Cozaar.  Discontinue IV Lopressor and placed back on home regimen of Norvasc.  Follow.      4.  Dehydration Hydrated with IV fluids.  Follow.    5.  Hyperglycemia Patient with no prior history of diabetes.  Hemoglobin A1c 6.3.  Blood glucose level on basic metabolic profile this morning at 113.  Will need outpatient follow-up.  6.  Gastroesophageal reflux disease Patient currently on IV PPI.  Will transition to oral PPI.  Follow.    7.  Anxiety disorder Celexa decreased to 20 mg daily.  Continue Elavil.  Follow.   8.  Hyperlipidemia Statin on hold.  Resume on discharge.   9.  Hypokalemia Potassium at 3.5.  K. Dur 40 mEq p.o. x1.   10.  Insomnia Continue Elavil.  11.  Constipation Patient placed on MiraLAX 17 g twice daily.  We will also give a dose of sorbitol.  Follow.   DVT prophylaxis: SCDs.   Code Status: Full Family Communication: Updated patient.  No family at bedside. Disposition:   Status is: Inpatient    Dispo: The patient is from: Home              Anticipated d/c is to: Likely home              Anticipated d/c date is: 1 to 2 days              Patient with acute emphysematous cholecystitis status post laparoscopic cholecystectomy per general surgery.  On IV antibiotics.  Tolerating regular diet.  Pain currently being managed.  Not stable for discharge.         Consultants:   Gastroenterology: Dr. Christella Hartigan 06/06/2020  General surgery: Dr.Gerkin 06/07/2020  Procedures:   CT angio 06/05/2020  Chest x-ray 06/05/2020  MRCP 06/06/2020  Laparoscopic cholecystectomy with intraoperative cholangiography per Dr. Gerrit Friends  06/07/2020  Antimicrobials:   IV Rocephin 06/05/2020>>>>  IV Flagyl 06/05/2020>>>>>  IV azithromycin 06/05/2020>>>>   Subjective: Patient sitting up on the side of the bed, eating breakfast.  Denies chest pain.  No shortness of breath.  No cough.  States has not had a bowel movement in several days close to a week.  Complaining of some abdominal pain right upper quadrant however improved from admission.    Objective: Vitals:   06/08/20 0713 06/08/20 1353 06/08/20 2131 06/09/20 0531  BP: (!) 145/81 (!) 150/75 (!) 169/90 (!) 158/86  Pulse: 78 82 88 86  Resp: 18 16 16 17   Temp: 97.8 F (36.6 C) 97.7 F (36.5 C) 98.4 F (36.9 C) 98.5 F (36.9 C)  TempSrc: Oral Oral Oral Oral  SpO2: 92% 96%  94%  Weight:      Height:        Intake/Output Summary (Last 24 hours) at 06/09/2020 1352 Last data filed  at 06/09/2020 0900 Gross per 24 hour  Intake 240 ml  Output 870 ml  Net -630 ml   Filed Weights   06/05/20 0852  Weight: 90.2 kg    Examination:  General exam: NAD. Respiratory system: Decreased breath sounds in the bases.  No wheezing.  Normal respiratory effort.  Speaking in full sentences. Cardiovascular system: Regular rate rhythm no murmurs rubs or gallops.  No JVD.  No lower extremity edema.  Gastrointestinal system: Abdomen is soft, nondistended, some tenderness to palpation right upper quadrant.  Positive bowel sounds.  No rebound.  No guarding.  Right-sided drain with soft, nondistended, some tender to palpation in the right upper quadrant around drain site.  Positive bowel sounds.  No rebound.  No guarding.  Right-sided drain with serosanguineous fluid.  Central nervous system: Alert and oriented. No focal neurological deficits. Extremities: Symmetric 5 x 5 power. Skin: No rashes, lesions or ulcers Psychiatry: Judgement and insight appear normal. Mood & affect appropriate.     Data Reviewed: I have personally reviewed following labs and imaging studies  CBC: Recent Labs   Lab 06/05/20 0901 06/06/20 0524 06/07/20 0603 06/08/20 0604 06/09/20 0548  WBC 21.0* 14.1* 11.2* 14.5* 9.5  NEUTROABS 18.9*  --  9.4* 12.1* 7.1  HGB 13.5 11.7* 11.6* 11.6* 12.1  HCT 40.2 34.8* 35.0* 34.7* 36.7  MCV 95.9 96.9 98.6 97.5 98.1  PLT 206 257 315 322 436*    Basic Metabolic Panel: Recent Labs  Lab 06/05/20 0901 06/06/20 0524 06/07/20 0603 06/08/20 0604 06/09/20 0548  NA 132* 134* 136 136 136  K 3.7 3.7 3.3* 4.0 3.5  CL 97* 102 101 102 100  CO2 23 23 24 25 28   GLUCOSE 200* 136* 89 119* 113*  BUN 21 13 10 10 9   CREATININE 1.13* 0.58 0.53 0.60 0.51  CALCIUM 9.6 8.2* 8.2* 8.5* 8.2*  MG  --   --  2.1  --  2.0    GFR: Estimated Creatinine Clearance: 64.6 mL/min (by C-G formula based on SCr of 0.51 mg/dL).  Liver Function Tests: Recent Labs  Lab 06/05/20 0901 06/06/20 0524 06/07/20 0603 06/08/20 0604 06/09/20 0548  AST 21 135* 64* 28 22  ALT 75* 134* 107* 75* 56*  ALKPHOS 103 223* 193* 155* 132*  BILITOT 1.2 2.8* 1.7* 1.1 0.9  PROT 6.9 6.4* 5.8* 5.8* 5.9*  ALBUMIN 3.4* 2.4* 2.3* 2.3* 2.5*    CBG: No results for input(s): GLUCAP in the last 168 hours.   Recent Results (from the past 240 hour(s))  SARS Coronavirus 2 by RT PCR (hospital order, performed in [facility] hospital lab) Nasopharyngeal Nasopharyngeal Swab     Status: None   Collection Time: 06/02/20 10:02 AM   Specimen: Nasopharyngeal Swab  Result Value Ref Range Status   SARS Coronavirus 2 NEGATIVE NEGATIVE Final    Comment: (NOTE) SARS-CoV-2 target nucleic acids are NOT DETECTED.  The SARS-CoV-2 RNA is generally detectable in upper and lower respiratory specimens during the acute phase of infection. The lowest concentration of SARS-CoV-2 viral copies this assay can detect is 250 copies / mL. A negative result does not preclude SARS-CoV-2 infection and should not be used as the sole basis for treatment or other patient management decisions.  A negative result may occur with improper  specimen collection / handling, submission of specimen other than nasopharyngeal swab, presence of viral mutation(s) within the areas targeted by this assay, and inadequate number of viral copies (<250 copies / mL). A negative result must  be combined with clinical observations, patient history, and epidemiological information.  Fact Sheet for Patients:   BoilerBrush.com.cy  Fact Sheet for Healthcare Providers: https://pope.com/  This test is not yet approved or  cleared by the [place] FDA and has been authorized for detection and/or diagnosis of SARS-CoV-2 by FDA under an Emergency Use Authorization (EUA).  This EUA will remain in effect (meaning this test can be used) for the duration of the COVID-19 declaration under Section 564(b)(1) of the Act, 21 U.S.C. section 360bbb-3(b)(1), unless the authorization is terminated or revoked sooner.  Performed at [facility], [address] [place], [place] [address]   Culture, blood (routine x 2)     Status: None (Preliminary result)   Collection Time: 06/05/20 10:00 AM   Specimen: BLOOD  Result Value Ref Range Status   Specimen Description   Final    BLOOD LEFT ANTECUBITAL Performed at [facility], [address] [place], [place] [address]    Special Requests   Final    BOTTLES DRAWN AEROBIC AND ANAEROBIC Blood Culture adequate volume Performed at [facility], [address] [place], [place] [address]    Culture   Final    NO GROWTH 4 DAYS Performed at [facility], [address], [place], [place] [address]    Report Status PENDING  Incomplete  SARS Coronavirus 2 by RT PCR (hospital order, performed in [facility] hospital lab) Nasopharyngeal Nasopharyngeal Swab     Status: None   Collection Time: 06/05/20 10:21 AM   Specimen: Nasopharyngeal Swab  Result Value Ref Range Status   SARS Coronavirus 2 NEGATIVE NEGATIVE Final    Comment:  (NOTE) SARS-CoV-2 target nucleic acids are NOT DETECTED.  The SARS-CoV-2 RNA is generally detectable in upper and lower respiratory specimens during the acute phase of infection. The lowest concentration of SARS-CoV-2 viral copies this assay can detect is 250 copies / mL. A negative result does not preclude SARS-CoV-2 infection and should not be used as the sole basis for treatment or other patient management decisions.  A negative result may occur with improper specimen collection / handling, submission of specimen other than nasopharyngeal swab, presence of viral mutation(s) within the areas targeted by this assay, and inadequate number of viral copies (<250 copies / mL). A negative result must be combined with clinical observations, patient history, and epidemiological information.  Fact Sheet for Patients:   BoilerBrush.com.cy  Fact Sheet for Healthcare Providers: https://pope.com/  This test is not yet approved or  cleared by the [place] FDA and has been authorized for detection and/or diagnosis of SARS-CoV-2 by FDA under an Emergency Use Authorization (EUA).  This EUA will remain in effect (meaning this test can be used) for the duration of the COVID-19 declaration under Section 564(b)(1) of the Act, 21 U.S.C. section 360bbb-3(b)(1), unless the authorization is terminated or revoked sooner.  Performed at [facility], [address] [place], [place] [address]   Culture, blood (routine x 2)     Status: None (Preliminary result)   Collection Time: 06/05/20 10:30 AM   Specimen: BLOOD  Result Value Ref Range Status   Specimen Description   Final    BLOOD BLOOD LEFT HAND Performed at [facility], [address] [place], [place] [address]    Special Requests   Final    BOTTLES DRAWN AEROBIC AND ANAEROBIC Blood Culture adequate volume Performed at [facility]  [address], [address] [place], [place] [address]    Culture   Final    NO GROWTH 4 DAYS Performed at [facility], [address] [place], [place] [address]    Report Status PENDING  Incomplete         Radiology Studies: No results found.      Scheduled Meds: . amLODipine  10 mg Oral Daily  . citalopram  20 mg Oral Daily  . cycloSPORINE  1 drop Both Eyes BID  . guaiFENesin  1,200 mg Oral BID  . losartan  50 mg Oral Daily  . metroNIDAZOLE  500 mg Oral Q8H  . pantoprazole (PROTONIX) IV  40 mg Intravenous Daily  . polyethylene glycol  17 g Oral BID   Continuous Infusions: . sodium chloride Stopped (06/05/20 1526)  . cefTRIAXone (ROCEPHIN)  IV 2 g (06/09/20 1200)     LOS: 4 days    Time spent: 35 minutes    Ramiro Harvestaniel Morgen Ritacco, MD [facility]   To contact the attending provider between 7A-7P or the covering provider during after hours 7P-7A, please log into the web site [email or web address] and access using universal  password for that web site. If you do not have the password, please call the hospital operator.  06/09/2020, 1:52 PM

## 2020-06-10 ENCOUNTER — Inpatient Hospital Stay (HOSPITAL_COMMUNITY): Payer: Medicare Other

## 2020-06-10 ENCOUNTER — Other Ambulatory Visit: Payer: Self-pay

## 2020-06-10 LAB — BASIC METABOLIC PANEL
Anion gap: 12 (ref 5–15)
BUN: 9 mg/dL (ref 8–23)
CO2: 25 mmol/L (ref 22–32)
Calcium: 8.2 mg/dL — ABNORMAL LOW (ref 8.9–10.3)
Chloride: 101 mmol/L (ref 98–111)
Creatinine, Ser: 0.5 mg/dL (ref 0.44–1.00)
GFR calc Af Amer: >60 mL/min (ref >60)
GFR calc non Af Amer: >60 mL/min (ref >60)
Glucose, Bld: 146 mg/dL — ABNORMAL HIGH (ref 70–99)
Potassium: 3.6 mmol/L (ref 3.5–5.1)
Sodium: 138 mmol/L (ref 135–145)

## 2020-06-10 LAB — CBC WITH DIFFERENTIAL/PLATELET
Abs Immature Granulocytes: 0.26 10*3/uL — ABNORMAL HIGH (ref 0.00–0.07)
Basophils Absolute: 0.1 10*3/uL (ref 0.0–0.1)
Basophils Relative: 1 %
Eosinophils Absolute: 0.1 10*3/uL (ref 0.0–0.5)
Eosinophils Relative: 1 %
HCT: 39 % (ref 36.0–46.0)
Hemoglobin: 12.9 g/dL (ref 12.0–15.0)
Immature Granulocytes: 4 %
Lymphocytes Relative: 16 %
Lymphs Abs: 1.1 10*3/uL (ref 0.7–4.0)
MCH: 32.7 pg (ref 26.0–34.0)
MCHC: 33.1 g/dL (ref 30.0–36.0)
MCV: 99 fL (ref 80.0–100.0)
Monocytes Absolute: 0.6 10*3/uL (ref 0.1–1.0)
Monocytes Relative: 8 %
Neutro Abs: 4.9 10*3/uL (ref 1.7–7.7)
Neutrophils Relative %: 70 %
Platelets: 526 10*3/uL — ABNORMAL HIGH (ref 150–400)
RBC: 3.94 MIL/uL (ref 3.87–5.11)
RDW: 13.3 % (ref 11.5–15.5)
WBC: 6.9 10*3/uL (ref 4.0–10.5)
nRBC: 0 % (ref 0.0–0.2)

## 2020-06-10 LAB — CULTURE, BLOOD (ROUTINE X 2)
Culture: NO GROWTH
Culture: NO GROWTH
Special Requests: ADEQUATE
Special Requests: ADEQUATE

## 2020-06-10 LAB — MAGNESIUM: Magnesium: 2.2 mg/dL (ref 1.7–2.4)

## 2020-06-10 LAB — SURGICAL PATHOLOGY

## 2020-06-10 MED ORDER — PANTOPRAZOLE SODIUM 40 MG PO TBEC
40.0000 mg | DELAYED_RELEASE_TABLET | Freq: Every day | ORAL | Status: DC
  Administered 2020-06-10 – 2020-06-12 (×3): 40 mg via ORAL
  Filled 2020-06-10 (×3): qty 1

## 2020-06-10 MED ORDER — MINERAL OIL RE ENEM
1.0000 | ENEMA | Freq: Once | RECTAL | Status: AC
  Administered 2020-06-10: 1 via RECTAL
  Filled 2020-06-10 (×3): qty 1

## 2020-06-10 MED ORDER — POTASSIUM CHLORIDE CRYS ER 20 MEQ PO TBCR
40.0000 meq | EXTENDED_RELEASE_TABLET | Freq: Once | ORAL | Status: AC
  Administered 2020-06-10: 40 meq via ORAL
  Filled 2020-06-10: qty 2

## 2020-06-10 NOTE — Progress Notes (Signed)
PROGRESS NOTE    Margaret Cross  VWU:[id number] DOB: 03-07-1941 DOA: 06/05/2020 PCP: Margaret Barban, MD    Chief Complaint  Patient presents with  . Abdominal Pain    Brief Narrative:  HPI per Dr. Jacqulyn Cross is a 79 y.o. female with a past medical history of anxiety disorder, essential hypertension who was in her usual state of health till about 2 weeks ago when she noticed that she had lost her appetite.  She felt fatigued.   She also had bouts of nausea and vomiting about a week ago. And then about 3 days prior to this admission patient started developing abdominal pain in the right upper quadrant along with some nausea.    She went to the emergency department and was evaluated and was noted to have abnormal LFTs.  A CT abdomen raised concern for inflammation in the gallbladder.  Abdominal ultrasound however did not show any acute cholecystitis.  Patient was discharged home.  She came back to the emergency department today due to persistent symptoms.  She has also developed some shortness of breath. She has chronic acid reflux and has a chronic cough as a result of the same. Her cough really has not changed much.  Evaluation done today showed improvement in the LFTs.  A CT angiogram was done which did not show any PE but raised concern for possible infectious process in the lung.  It was felt that perhaps this was causing her abdominal symptoms.  Patient currently denies any chest pain.  She continues to have pain in the right upper abdomen which she rates at 6 out of 10 in intensity without any radiation.  No precipitating aggravating or relieving factors.  She did have fever last week as well.  She has noticed that she tends to get a little short of breath when she talks a lot.  Denies any diarrhea.  See above for ED course.  Assessment & Plan:   Principal Problem:   Acute emphysematous cholecystitis Active Problems:   Transaminitis   Acute respiratory failure with  hypoxia (HCC)   CAP (community acquired pneumonia)   RUQ pain   Essential hypertension   Anxiety   Hyperglycemia   Hypokalemia   Constipation  1 acute emphysematous cholecystitis  Concern for gallbladder disease versus biliary disease initially on admission as patient had presented with right upper quadrant abdominal pain with a transaminitis..  Patient with a prior 2-week history of nausea decreased appetite, emesis, right upper quadrant abdominal pain noted to have elevated transaminitis.  CT abdomen done 06/02/2020 on presentation to the ED, did suggest some inflammation in the gallbladder however right upper quadrant ultrasound done a few days ago was negative for acute cholecystitis.  Patient with some improvement with right upper quadrant pain postoperatively.  Patient also noted to have a low-grade fever.  Patient on admission with a leukocytosis, worsening transaminitis. HIDA scan ordered however unable to be done on 06/06/2020 due to lack of tracer.  Acute hepatitis panel done negative.  GI was consulted and patient seen in consultation by Dr. Christella Hartigan who recommended/ordered MRCP done the evening of 06/06/2020, which was concerning for acute emphysematous cholecystitis.  General surgery consulted and patient seen in consultation by Dr.Gerkin who assessed the patient and patient underwent laparoscopic cholecystectomy with intraoperative cholangiogram 06/07/2020 without any complications.  Patient tolerating regular diet.  Drain has been removed per general surgery this morning.  Patient with complaints of constipation.  Will discontinue IV antibiotics after today's doses.  Patient despite multiple laxatives with no bowel movement and as such we will order abdominal films to rule out ileus versus small bowel obstruction.   General surgery following and I appreciate input and recommendations.  GI was following but signed off as of 06/07/2020.   2.  Acute respiratory failure with hypoxia/??  Community-acquired  pneumonia Patient denies any cough.  Patient however with nausea and vomiting prior to admission, and likely could have aspirated.  CT angiogram chest done for pulmonary embolus, bilateral lower lobe airspace opacities R > L which could reflect atelectasis or pneumonia.  Trace right pleural effusion.  MRCP with small bilateral effusions noted R > L.  SARS coronavirus 2 PCR negative.  Blood cultures negative x5 days.  Was on azithromycin that has been completed.  Continue IV Rocephin and IV Flagyl through today and discontinue after today's doses.  Follow.    3.  Hypertension Continue Cozaar, Norvasc. Follow.      4.  Dehydration Hydrated with IV fluids.  Follow.    5.  Hyperglycemia Patient with no prior history of diabetes.  Hemoglobin A1c 6.3.  Blood glucose level on basic metabolic profile this morning at 113.  Will need outpatient follow-up.  6.  Gastroesophageal reflux disease Patient currently on IV PPI.  Will transition to oral PPI.  Follow.    7.  Anxiety disorder Celexa decreased to 20 mg daily.  Continue Elavil.  Follow.   8.  Hyperlipidemia Statin on hold.  Likely resume statin post discharge.    9.  Hypokalemia Potassium at 3.6.   10.  Insomnia Elavil.   11.  Constipation Patient placed on MiraLAX 17 g twice daily.  Patient received a Dulcolax suppository, sorbitol, magnesium citrate with no results.  Patient given mineral oil enema this morning with no results.  Patient with some abdominal distention.  Will get abdominal films to rule out ileus versus small bowel obstruction.  If abdominal films are unremarkable we will try a smog enema.  General surgery following.    DVT prophylaxis: SCDs.   Code Status: Full Family Communication: Updated patient.  No family at bedside. Disposition:   Status is: Inpatient    Dispo: The patient is from: Home              Anticipated d/c is to: Likely home              Anticipated d/c date is: 1 to 2 days              Patient  with acute emphysematous cholecystitis status post laparoscopic cholecystectomy per general surgery.  On IV antibiotics.  Tolerating regular diet.  Patient with complaints of constipation, some abdominal distention, no bowel movement in over a week.  Concern for possible ileus.  Not stable for discharge.        Consultants:   Gastroenterology: Dr. Christella Hartigan 06/06/2020  General surgery: Dr.Gerkin 06/07/2020  Procedures:   CT angio 06/05/2020  Chest x-ray 06/05/2020  MRCP 06/06/2020  Laparoscopic cholecystectomy with intraoperative cholangiography per Dr. Gerrit Friends 06/07/2020  Antimicrobials:   IV Rocephin 06/05/2020>>>> 06/10/2020  IV Flagyl 06/05/2020>>>>> 06/10/2020  IV azithromycin 06/05/2020>>>> 06/09/2020   Subjective: Patient laying in bed.  Still no bowel movement despite magnesium citrate, Dulcolax suppository,, sorbitol.  Patient with complaints of constipation.  Some improvement with abdominal pain.  Denies any shortness of breath.  No chest pain.  Stated JP drain removed and was in significant pain during removal.    Objective: Vitals:  06/09/20 1359 06/09/20 1857 06/10/20 0632 06/10/20 0751  BP:  (!) 146/73 (!) 152/80   Pulse:  90 88   Resp:  18 17   Temp:  98 F (36.7 C) 98.3 F (36.8 C)   TempSrc:  Oral Oral   SpO2: 94% 93% (!) 85% 92%  Weight:      Height:        Intake/Output Summary (Last 24 hours) at 06/10/2020 0849 Last data filed at 06/10/2020 0630 Gross per 24 hour  Intake 480 ml  Output 1530 ml  Net -1050 ml   Filed Weights   06/05/20 0852  Weight: 90.2 kg    Examination:  General exam: NAD. Respiratory system: Some decreased breath sounds in the bases otherwise clear.  No wheezing, no rhonchi, no crackles.  Normal respiratory effort.  Speaking in full sentences.  Cardiovascular system: RRR no murmurs rubs or gallops.  No JVD.  No lower extremity edema.  Gastrointestinal system: Abdomen is soft, mildly distended, some tenderness to palpation right upper  quadrant, decreased bowel sounds.  No rebound.  No guarding.  Right-sided drain has been removed.  Central nervous system: Alert and oriented. No focal neurological deficits. Extremities: Symmetric 5 x 5 power. Skin: No rashes, lesions or ulcers Psychiatry: Judgement and insight appear normal. Mood & affect appropriate.     Data Reviewed: I have personally reviewed following labs and imaging studies  CBC: Recent Labs  Lab 06/05/20 0901 06/05/20 0901 06/06/20 0524 06/07/20 0603 06/08/20 0604 06/09/20 0548 06/10/20 0623  WBC 21.0*   < > 14.1* 11.2* 14.5* 9.5 6.9  NEUTROABS 18.9*  --   --  9.4* 12.1* 7.1 4.9  HGB 13.5   < > 11.7* 11.6* 11.6* 12.1 12.9  HCT 40.2   < > 34.8* 35.0* 34.7* 36.7 39.0  MCV 95.9   < > 96.9 98.6 97.5 98.1 99.0  PLT 206   < > 257 315 322 436* 526*   < > = values in this interval not displayed.    Basic Metabolic Panel: Recent Labs  Lab 06/06/20 0524 06/07/20 0603 06/08/20 0604 06/09/20 0548 06/10/20 0623  NA 134* 136 136 136 138  K 3.7 3.3* 4.0 3.5 3.6  CL 102 101 102 100 101  CO2 23 24 25 28 25   GLUCOSE 136* 89 119* 113* 146*  BUN 13 10 10 9 9   CREATININE 0.58 0.53 0.60 0.51 0.50  CALCIUM 8.2* 8.2* 8.5* 8.2* 8.2*  MG  --  2.1  --  2.0  --     GFR: Estimated Creatinine Clearance: 64.6 mL/min (by C-G formula based on SCr of 0.5 mg/dL).  Liver Function Tests: Recent Labs  Lab 06/05/20 0901 06/06/20 0524 06/07/20 0603 06/08/20 0604 06/09/20 0548  AST 21 135* 64* 28 22  ALT 75* 134* 107* 75* 56*  ALKPHOS 103 223* 193* 155* 132*  BILITOT 1.2 2.8* 1.7* 1.1 0.9  PROT 6.9 6.4* 5.8* 5.8* 5.9*  ALBUMIN 3.4* 2.4* 2.3* 2.3* 2.5*    CBG: No results for input(s): GLUCAP in the last 168 hours.   Recent Results (from the past 240 hour(s))  SARS Coronavirus 2 by RT PCR (hospital order, performed [facility]) Nasopharyngeal Nasopharyngeal Swab     Status: None   Collection Time: 06/02/20 10:02 AM   Specimen: Nasopharyngeal  Swab  Result Value Ref Range Status   SARS Coronavirus 2 NEGATIVE NEGATIVE Final    Comment: (NOTE) SARS-CoV-2 target nucleic acids are NOT DETECTED.  The  SARS-CoV-2 RNA is generally detectable in upper and lower respiratory specimens during the acute phase of infection. The lowest concentration of SARS-CoV-2 viral copies this assay can detect is 250 copies / mL. A negative result does not preclude SARS-CoV-2 infection and should not be used as the sole basis for treatment or other patient management decisions.  A negative result may occur with improper specimen collection / handling, submission of specimen other than nasopharyngeal swab, presence of viral mutation(s) within the areas targeted by this assay, and inadequate number of viral copies (<250 copies / mL). A negative result must be combined with clinical observations, patient history, and epidemiological information.  Fact Sheet for Patients:   BoilerBrush.com.cy  Fact Sheet for Healthcare Providers: https://pope.com/  This test is not yet approved or  cleared by the [place] FDA and has been authorized for detection and/or diagnosis of SARS-CoV-2 by FDA under an Emergency Use Authorization (EUA).  This EUA will remain in effect (meaning this test can be used) for the duration of the COVID-19 declaration under Section 564(b)(1) of the Act, 21 U.S.C. section 360bbb-3(b)(1), unless the authorization is terminated or revoked sooner.  Performed at [facility], [address] [place], [place] [address]   Culture, blood (routine x 2)     Status: None (Preliminary result)   Collection Time: 06/05/20 10:00 AM   Specimen: BLOOD  Result Value Ref Range Status   Specimen Description   Final    BLOOD LEFT ANTECUBITAL Performed at [facility], [address] [place], [place] [address]    Special Requests   Final    BOTTLES DRAWN AEROBIC AND ANAEROBIC  Blood Culture adequate volume Performed at [facility], [address] [place], [place] [address]    Culture   Final    NO GROWTH 4 DAYS Performed at [facility], [address], [place], [place] [address]    Report Status PENDING  Incomplete  SARS Coronavirus 2 by RT PCR (hospital order, performed in [facility] hospital lab) Nasopharyngeal Nasopharyngeal Swab     Status: None   Collection Time: 06/05/20 10:21 AM   Specimen: Nasopharyngeal Swab  Result Value Ref Range Status   SARS Coronavirus 2 NEGATIVE NEGATIVE Final    Comment: (NOTE) SARS-CoV-2 target nucleic acids are NOT DETECTED.  The SARS-CoV-2 RNA is generally detectable in upper and lower respiratory specimens during the acute phase of infection. The lowest concentration of SARS-CoV-2 viral copies this assay can detect is 250 copies / mL. A negative result does not preclude SARS-CoV-2 infection and should not be used as the sole basis for treatment or other patient management decisions.  A negative result may occur with improper specimen collection / handling, submission of specimen other than nasopharyngeal swab, presence of viral mutation(s) within the areas targeted by this assay, and inadequate number of viral copies (<250 copies / mL). A negative result must be combined with clinical observations, patient history, and epidemiological information.  Fact Sheet for Patients:   BoilerBrush.com.cy  Fact Sheet for Healthcare Providers: https://pope.com/  This test is not yet approved or  cleared by the [place] FDA and has been authorized for detection and/or diagnosis of SARS-CoV-2 by FDA under an Emergency Use Authorization (EUA).  This EUA will remain in effect (meaning this test can be used) for the duration of the COVID-19 declaration under Section 564(b)(1) of the Act, 21 U.S.C. section 360bbb-3(b)(1), unless the authorization is terminated  or revoked sooner.  Performed at [facility], [address] [place], [place] [address]   Culture, blood (routine x 2)     Status: None (Preliminary result)   Collection Time: 06/05/20 10:30 AM   Specimen: BLOOD  Result Value Ref Range Status   Specimen Description   Final    BLOOD BLOOD LEFT HAND Performed at [facility], [address] [place], [place] [address]    Special Requests   Final    BOTTLES DRAWN AEROBIC AND ANAEROBIC Blood Culture adequate volume Performed at [facility], [address] [place], [place] [address]    Culture   Final    NO GROWTH 4 DAYS Performed at [facility], [address], [place], [place] [address]    Report Status PENDING  Incomplete         Radiology Studies: No results found.      Scheduled Meds: . amLODipine  10 mg Oral Daily  . citalopram  20 mg Oral Daily  . cycloSPORINE  1 drop Both Eyes BID  . guaiFENesin  1,200 mg Oral BID  . losartan  50 mg Oral Daily  . metroNIDAZOLE  500 mg Oral Q8H  . mineral oil  1 enema Rectal Once  . pantoprazole (PROTONIX) IV  40 mg Intravenous Daily  . polyethylene glycol  17 g Oral BID   Continuous Infusions: . sodium chloride Stopped (06/05/20 1526)  . cefTRIAXone (ROCEPHIN)  IV 2 g (06/09/20 1200)     LOS: 5 days    Time spent: 35 minutes    Ramiro Harvest, MD [facility]   To contact the attending provider between 7A-7P or the covering provider during after hours 7P-7A, please log into the web site [email or web address] and access using universal [place] password for that web site. If you do not have the password, please call the hospital operator.  06/10/2020, 8:49 AM

## 2020-06-10 NOTE — Discharge Instructions (Addendum)
[facility] ______[facility] °LAPAROSCOPIC SURGERY: POST OP INSTRUCTIONS °Always review your discharge instruction sheet given to you by the facility where your surgery was performed. °IF YOU HAVE DISABILITY OR FAMILY LEAVE FORMS, YOU MUST BRING THEM TO THE OFFICE FOR PROCESSING.   °DO NOT GIVE THEM TO YOUR DOCTOR. ° °1. A prescription for pain medication may be given to you upon discharge.  Take your pain medication as prescribed, if needed.  If narcotic pain medicine is not needed, then you may take acetaminophen (Tylenol) or ibuprofen (Advil) as needed. °2. Take your usually prescribed medications unless otherwise directed. °3. If you need a refill on your pain medication, please contact your pharmacy.  They will contact our office to request authorization. Prescriptions will not be filled after 5pm or on week-ends. °4. You should follow a light diet the first few days after arrival home, such as soup and crackers, etc.  Be sure to include lots of fluids daily. °5. Most patients will experience some swelling and bruising in the area of the incisions.  Ice packs will help.  Swelling and bruising can take several days to resolve.  °6. It is common to experience some constipation if taking pain medication after surgery.  Increasing fluid intake and taking a stool softener (such as Colace) will usually help or prevent this problem from occurring.  A mild laxative (Milk of Magnesia or Miralax) should be taken according to package instructions if there are no bowel movements after 48 hours. °7. Unless discharge instructions indicate otherwise, you may remove your bandages 24-48 hours after surgery, and you may shower at that time.  You may have steri-strips (small skin tapes) in place directly over the incision.  These strips should be left on the skin for 7-10 days.  If your surgeon used skin glue on the incision, you may shower in 24 hours.  The glue will flake off over the next 2-3 weeks.  Any sutures or  staples will be removed at the office during your follow-up visit. °8. ACTIVITIES:  You may resume regular (light) daily activities beginning the next day--such as daily self-care, walking, climbing stairs--gradually increasing activities as tolerated.  You may have sexual intercourse when it is comfortable.  Refrain from any heavy lifting or straining until approved by your doctor. °a. You may drive when you are no longer taking prescription pain medication, you can comfortably wear a seatbelt, and you can safely maneuver your car and apply brakes. °b. RETURN TO WORK:  __________________________________________________________ °9. You should see your doctor in the office for a follow-up appointment approximately 2-3 weeks after your surgery.  Make sure that you call for this appointment within a day or two after you arrive home to insure a convenient appointment time. °10. OTHER INSTRUCTIONS: __________________________________________________________________________________________________________________________ __________________________________________________________________________________________________________________________ °WHEN TO CALL YOUR DOCTOR: °1. Fever over 101.0 °2. Inability to urinate °3. Continued bleeding from incision. °4. Increased pain, redness, or drainage from the incision. °5. Increasing abdominal pain ° °The clinic staff is available to answer your questions during regular business hours.  Please don’t hesitate to call and ask to speak to one of the nurses for clinical concerns.  If you have a medical emergency, go to the nearest emergency room or call 911.  A surgeon from [facility] is always on call at the hospital. °[address], [address], [place], [place]  [address] ? [address] [address], [place], [place]   [address] °[phone] ? [phone] ? FAX [phone] °Web site:   [email or web address] ° °GETTING TO GOOD BOWEL HEALTH. °Irregular bowel habits such  as constipation and diarrhea can lead to many problems over time.  Having one soft bowel movement a day is the most important way to prevent further problems.  The anorectal canal is designed to handle stretching and feces to safely manage our ability to get rid of solid waste (feces, poop, stool) out of our body.  BUT, hard constipated stools can act like ripping concrete bricks and diarrhea can be a burning fire to this very sensitive area of our body, causing inflamed hemorrhoids, anal fissures, increasing risk is perirectal abscesses, abdominal pain/bloating, an making irritable bowel worse.     °The goal: ONE SOFT BOWEL MOVEMENT A DAY!  To have soft, regular bowel movements:  °• Drink at least 8 tall glasses of water a day.   °• Take plenty of fiber.  Fiber is the undigested part of plant food that passes into the colon, acting s “natures broom” to encourage bowel motility and movement.  Fiber can absorb and hold large amounts of water. This results in a larger, bulkier stool, which is soft and easier to pass. Work gradually over several weeks up to 6 servings a day of fiber (25g a day even more if needed) in the form of: °o Vegetables -- Root (potatoes, carrots, turnips), leafy green (lettuce, salad greens, celery, spinach), or cooked high residue (cabbage, broccoli, etc) °o Fruit -- Fresh (unpeeled skin & pulp), Dried (prunes, apricots, cherries, etc ),  or stewed ( applesauce)  °o Whole grain breads, pasta, etc (whole wheat)  °o Bran cereals  °• Bulking Agents -- This type of water-retaining fiber generally is easily obtained each day by one of the following:  °o Psyllium bran -- The psyllium plant is remarkable because its ground seeds can retain so much water. This product is available as Metamucil, Konsyl, Effersyllium, Per Diem Fiber, or the less expensive generic preparation in drug and health food stores. Although labeled a laxative, it really is not a laxative.  °o Methylcellulose -- This is another  fiber derived from wood which also retains water. It is available as Citrucel. °o Polyethylene Glycol - and “artificial” fiber commonly called Miralax or Glycolax.  It is helpful for people with gassy or bloated feelings with regular fiber °o Flax Seed - a less gassy fiber than psyllium °• No reading or other relaxing activity while on the toilet. If bowel movements take longer than 5 minutes, you are too constipated °• AVOID CONSTIPATION.  High fiber and water intake usually takes care of this.  Sometimes a laxative is needed to stimulate more frequent bowel movements, but  °• Laxatives are not a good long-term solution as it can wear the colon out. °o Osmotics (Milk of Magnesia, Fleets phosphosoda, Magnesium citrate, MiraLax, GoLytely) are safer than  °o Stimulants (Senokot, Castor Oil, Dulcolax, Ex Lax)    °o Do not take laxatives for more than 7days in a row. °•  IF SEVERELY CONSTIPATED, try a Bowel Retraining Program: °o Do not use laxatives.  °o Eat a diet high in roughage, such as bran cereals and leafy vegetables.  °o Drink six (6) ounces of prune or apricot juice each morning.  °o Eat two (2) large servings of stewed fruit each day.  °o Take one (1) heaping tablespoon of a psyllium-based bulking agent twice a day. Use sugar-free sweetener when possible to avoid excessive calories.  °o Eat a normal breakfast.  °o Set aside 15 minutes   after breakfast to sit on the toilet, but do not strain to have a bowel movement.  °o If you do not have a bowel movement by the third day, use an enema and repeat the above steps.  °• Controlling diarrhea °o Switch to liquids and simpler foods for a few days to avoid stressing your intestines further. °o Avoid dairy products (especially milk & ice cream) for a short time.  The intestines often can lose the ability to digest lactose when stressed. °o Avoid foods that cause gassiness or bloating.  Typical foods include beans and other legumes, cabbage, broccoli, and dairy foods.   Every person has some sensitivity to other foods, so listen to our body and avoid those foods that trigger problems for you. °o Adding fiber (Citrucel, Metamucil, psyllium, Miralax) gradually can help thicken stools by absorbing excess fluid and retrain the intestines to act more normally.  Slowly increase the dose over a few weeks.  Too much fiber too soon can backfire and cause cramping & bloating. °o Probiotics (such as active yogurt, Align, etc) may help repopulate the intestines and colon with normal bacteria and calm down a sensitive digestive tract.  Most studies show it to be of mild help, though, and such products can be costly. °o Medicines: °- Bismuth subsalicylate (ex. Kayopectate, Pepto Bismol) every 30 minutes for up to 6 doses can help control diarrhea.  Avoid if pregnant. °- Loperamide (Immodium) can slow down diarrhea.  Start with two tablets (4mg total) first and then try one tablet every 6 hours.  Avoid if you are having fevers or severe pain.  If you are not better or start feeling worse, stop all medicines and call your doctor for advice °o Call your doctor if you are getting worse or not better.  Sometimes further testing (cultures, endoscopy, X-ray studies, bloodwork, etc) may be needed to help diagnose and treat the cause of the diarrhea. °

## 2020-06-10 NOTE — Progress Notes (Addendum)
3 Days Post-Op    CC: Abdominal pain  Subjective: Primary complaint this a.m. is constipation. So far she has had a Dulcolax suppository, 1 bottle of mag citrate, 1 dose of MiraLAX, sorbitol, without success. Her port sites look fine. Her JP drain is serosanguineous and was removed.  Objective: Vital signs in last 24 hours: Temp:  [98 F (36.7 C)-98.3 F (36.8 C)] 98.3 F (36.8 C) (08/10 1610) Pulse Rate:  [88-90] 88 (08/10 0632) Resp:  [17-18] 17 (08/10 0632) BP: (146-152)/(73-80) 152/80 (08/10 0632) SpO2:  [85 %-94 %] 85 % (08/10 9604) Last BM Date: 06/04/20 480 p.o. 2000 urine 30 drain Afebrile vital signs are stable Potassium 3.6, creatinine 0.5 WBC 6.9 H/H 12.9/39 Intake/Output from previous day: 08/09 0701 - 08/10 0700 In: 480 [P.O.:480] Out: 2030 [Urine:2000; Drains:30] Intake/Output this shift: No intake/output data recorded.  General appearance: alert, cooperative and no distress Resp: clear to auscultation bilaterally and Off O2 this AM. GI: Sore, slightly distended. Port sites look fine. Tolerating soft diet. No BM x6 days. JP drain was serosanguineous and removed.  Lab Results:  Recent Labs    06/09/20 0548 06/10/20 0623  WBC 9.5 6.9  HGB 12.1 12.9  HCT 36.7 39.0  PLT 436* 526*    BMET Recent Labs    06/09/20 0548 06/10/20 0623  NA 136 138  K 3.5 3.6  CL 100 101  CO2 28 25  GLUCOSE 113* 146*  BUN 9 9  CREATININE 0.51 0.50  CALCIUM 8.2* 8.2*   PT/INR No results for input(s): LABPROT, INR in the last 72 hours.  Recent Labs  Lab 06/05/20 0901 06/06/20 0524 06/07/20 0603 06/08/20 0604 06/09/20 0548  AST 21 135* 64* 28 22  ALT 75* 134* 107* 75* 56*  ALKPHOS 103 223* 193* 155* 132*  BILITOT 1.2 2.8* 1.7* 1.1 0.9  PROT 6.9 6.4* 5.8* 5.8* 5.9*  ALBUMIN 3.4* 2.4* 2.3* 2.3* 2.5*     Lipase     Component Value Date/Time   LIPASE 26 06/05/2020 0901     Medications: . amLODipine  10 mg Oral Daily  . citalopram  20 mg Oral Daily   . cycloSPORINE  1 drop Both Eyes BID  . guaiFENesin  1,200 mg Oral BID  . losartan  50 mg Oral Daily  . metroNIDAZOLE  500 mg Oral Q8H  . pantoprazole (PROTONIX) IV  40 mg Intravenous Daily  . polyethylene glycol  17 g Oral BID    Assessment/Plan Acute respiratory failure with hypoxia/possible community-acquired pneumonia Hypertension Dehydration Hyperglycemia GERD Anxiety disorder Hyperlipidemia Hypokalemia Constipation   Acute emphysematous colitis Laparoscopic cholecystectomy with intraoperative cholangiogram, 19 French drain placement, 06/07/2020, Dr. Darnell Level, POD #2  FEN: Regular diet ID: Rocephin 8/5-06/07/2020;  azithromycin 8/5 >> day 5; Flagyl 8/5>> day 5 DVT: SCDs; she can have DVT chemical prophylaxis from our standpoint. Follow-up: [facility]  Plan: From a surgical standpoint she is doing well. I will add a fleets enema. Her drain was removed. Follow-up is on the chart. Please call again if we can be of further assistance.      LOS: 5 days    Cliffie Gingras 06/10/2020 Please see Amion

## 2020-06-11 ENCOUNTER — Inpatient Hospital Stay (HOSPITAL_COMMUNITY): Payer: Medicare Other

## 2020-06-11 DIAGNOSIS — K5904 Chronic idiopathic constipation: Secondary | ICD-10-CM

## 2020-06-11 MED ORDER — LOSARTAN POTASSIUM 50 MG PO TABS
75.0000 mg | ORAL_TABLET | Freq: Every day | ORAL | Status: DC
  Administered 2020-06-12: 75 mg via ORAL
  Filled 2020-06-11: qty 2

## 2020-06-11 NOTE — Progress Notes (Signed)
4 Days Post-Op  Subjective: CC: Patient reports that last night she began passing flatus and her pain/distension improved. Still having occasional nausea but no emesis. Has had a few sips of clears this am without increase in symptoms. Notes that she has had 3-4 episodes of diarrhea this am. Currently on a FLD. Up in chair this am. Still feels tired and did not work with OT this AM.   Objective: Vital signs in last 24 hours: Temp:  [97.9 F (36.6 C)-98.6 F (37 C)] 98 F (36.7 C) (08/11 0603) Pulse Rate:  [92-94] 92 (08/11 0603) Resp:  [17-20] 17 (08/11 0603) BP: (154-172)/(87-95) 167/95 (08/11 0603) SpO2:  [90 %-93 %] 92 % (08/11 0603) Last BM Date: 06/11/20  Intake/Output from previous day: No intake/output data recorded. Intake/Output this shift: Total I/O In: 360 [P.O.:360] Out: -   PE: Gen:  Alert, NAD, pleasant Pulm: Rate and effort normal  Abd: Soft, mild distension, some epigastric and ruq tenderness that does not seem out of proportion to what I would expect post-operatively. +BS. Prior drain site dressed c/d/i. Incisions with glue intact appears well and are without drainage, bleeding, or signs of infection.  Ext:  No LE edema  Psych: A&Ox3  Skin: no rashes noted, warm and dry  Lab Results:  Recent Labs    06/09/20 0548 06/10/20 0623  WBC 9.5 6.9  HGB 12.1 12.9  HCT 36.7 39.0  PLT 436* 526*   BMET Recent Labs    06/09/20 0548 06/10/20 0623  NA 136 138  K 3.5 3.6  CL 100 101  CO2 28 25  GLUCOSE 113* 146*  BUN 9 9  CREATININE 0.51 0.50  CALCIUM 8.2* 8.2*   PT/INR No results for input(s): LABPROT, INR in the last 72 hours. CMP     Component Value Date/Time   NA 138 06/10/2020 0623   K 3.6 06/10/2020 0623   CL 101 06/10/2020 0623   CO2 25 06/10/2020 0623   GLUCOSE 146 (H) 06/10/2020 0623   BUN 9 06/10/2020 0623   CREATININE 0.50 06/10/2020 0623   CALCIUM 8.2 (L) 06/10/2020 0623   PROT 5.9 (L) 06/09/2020 0548   ALBUMIN 2.5 (L)  06/09/2020 0548   AST 22 06/09/2020 0548   ALT 56 (H) 06/09/2020 0548   ALKPHOS 132 (H) 06/09/2020 0548   BILITOT 0.9 06/09/2020 0548   GFRNONAA >60 06/10/2020 0623   GFRAA >60 06/10/2020 0623   Lipase     Component Value Date/Time   LIPASE 26 06/05/2020 0901       Studies/Results: DG Abd 2 Views  Result Date: 06/10/2020 CLINICAL DATA:  79 year old female with abdominal distension status post laparoscopic cholecystectomy. EXAM: ABDOMEN - 2 VIEW COMPARISON:  Abdominal radiograph dated 06/06/2019 and CT abdomen pelvis dated 06/02/2020. FINDINGS: There is severe diffuse gaseous dilatation of the:. The rectal vault measures approximately 12 cm in diameter. There is air and fluid within the colon with air-fluid level. No definite dilated small bowel. No free air identified. Right upper quadrant cholecystectomy clips. Upper abdominal vascular calcification. Several surgical clips noted in the right hemipelvis. IMPRESSION: Severe diffuse gaseous dilatation of the colon. Electronically Signed   By: Elgie Collard M.D.   On: 06/10/2020 17:05    Anti-infectives: Anti-infectives (From admission, onward)   Start     Dose/Rate Route Frequency Ordered Stop   06/08/20 1000  azithromycin (ZITHROMAX) tablet 500 mg        500 mg Oral Daily 06/08/20 0747 06/09/20 0935  06/08/20 0800  metroNIDAZOLE (FLAGYL) tablet 500 mg        500 mg Oral Every 8 hours 06/08/20 0747 06/10/20 1528   06/06/20 1200  azithromycin (ZITHROMAX) tablet 500 mg  Status:  Discontinued        500 mg Oral Daily 06/05/20 1648 06/06/20 1051   06/06/20 1200  azithromycin (ZITHROMAX) 500 mg in sodium chloride 0.9 % 250 mL IVPB  Status:  Discontinued        500 mg 250 mL/hr over 60 Minutes Intravenous Daily 06/06/20 1051 06/08/20 0746   06/06/20 1000  cefTRIAXone (ROCEPHIN) 2 g in sodium chloride 0.9 % 100 mL IVPB        2 g 200 mL/hr over 30 Minutes Intravenous Every 24 hours 06/05/20 1648 06/11/20 0959   06/05/20 1700   metroNIDAZOLE (FLAGYL) IVPB 500 mg  Status:  Discontinued        500 mg 100 mL/hr over 60 Minutes Intravenous Every 8 hours 06/05/20 1648 06/08/20 0746   06/05/20 1000  cefTRIAXone (ROCEPHIN) 1 g in sodium chloride 0.9 % 100 mL IVPB        1 g 200 mL/hr over 30 Minutes Intravenous  Once 06/05/20 0945 06/05/20 1121   06/05/20 1000  azithromycin (ZITHROMAX) 500 mg in sodium chloride 0.9 % 250 mL IVPB        500 mg 250 mL/hr over 60 Minutes Intravenous  Once 06/05/20 0945 06/05/20 1300       Assessment/Plan Acute respiratory failure with hypoxia/possible community-acquired pneumonia Hypertension Dehydration Hyperglycemia GERD Anxiety disorder Hyperlipidemia Hypokalemia Constipation  - Per [facility] -  Acute emphysematous colitis S/p Laparoscopic cholecystectomy with intraoperative cholangiogram, 19 French drain placement, 06/07/2020, Dr. Darnell Level - POD #3 - Drain d/c'd 8/10 - Ileus improving. ROBF overnight/this AM. Given degree of colonic distension on xray yesterday will repeat this AM. Cont FLD for now.  - Mobilize for bowel function - Keep K > 4 and Mg > 2 for bowel function - Pulm toilet   FEN: FLD  ID: azithromycin 8/5 - 8/8; Flagyl 8/5 - 8/10. Rocephin 8/5 >> (on for PNA, per [facility]) DVT: SCDs; she can have DVT chemical prophylaxis from a gen surgery standpoint Follow-up: [facility] clinic    LOS: 6 days    Jacinto Halim , [facility] 06/11/2020, 11:07 AM Please see Amion for pager number during day hours 7:00am-4:30pm

## 2020-06-11 NOTE — Progress Notes (Signed)
OT Cancellation Note  Patient Details Name: Margaret Cross MRN: [id number] DOB: 06-Jun-1941   Cancelled Treatment:    Reason Eval/Treat Not Completed: Other (comment) Upon arrival patient ambulating independently from bathroom, reports having very little energy "I haven't eaten anything" and politely declines OT. Patient reports did have a bowel movement "I'm hoping they let me go home soon." Will check back as schedule permits.  Marlyce Huge OT OT pager: [phone]   Carmelia Roller 06/11/2020, 7:53 AM

## 2020-06-11 NOTE — Progress Notes (Signed)
PROGRESS NOTE    Margaret Cross  ZOX:[id number]  DOB: 1940-12-14  PCP: Harold Barban, MD Admit date:06/05/2020 Chief compliant: Abdominal pain Hospital course: 79 y.o.femalewith a past medical history of anxiety disorder, essential hypertension who was in her usual state of health till about 2 weeks ago when she noticed that shehad lost herappetite. She felt fatigued. She also had bouts of nausea and vomiting about a week ago. And then about 3 days prior to this admission patient started developing abdominal pain in the right upper quadrant along with some nausea. She went to the emergency department and was evaluated and was noted to have abnormal LFTs. A CT abdomen raised concern for inflammation in the gallbladder. Abdominal ultrasound however did not show any acute cholecystitis. Patient was discharged home. She came back to the emergency department due to persistent symptoms. She has also developed some shortness of breath on walking. She has chronic acid reflux and has a chronic cough as a result of the same.A CT angiogram was done which did not show any PE but raised concern for possible infectious process in the lung. It was felt that perhaps this was causing her abdominal symptoms.  Patient admitted to [facility] service.  GI was consulted and patient seen in consultation by Dr. Christella Hartigan who recommended MRCP (HIDA scan could not be done due to lack of tracer).    Subjective:  Patient out of bed to chair.  Still has some abdominal bloating but reports good sized BM earlier today.  Asking if diet can be advanced.  Blood pressure somewhat elevated Objective: Vitals:   06/10/20 1322 06/10/20 2101 06/11/20 0603 06/11/20 1203  BP: (!) 163/90 (!) 154/87 (!) 167/95 (!) 151/89  Pulse: 94 92 92 96  Resp: Temp: 97.9 F (36.6 C) 98.4 F (36.9 C) 98 F (36.7 C) 97.9 F (36.6 C)  TempSrc: Oral Oral Oral Oral  SpO2: 93% 90% 92% 93%  Weight:      Height:         Intake/Output Summary (Last 24 hours) at 06/11/2020 1617 Last data filed at 06/11/2020 0900 Gross per 24 hour  Intake 360 ml  Output --  Net 360 ml   Filed Weights   06/05/20 0852  Weight: 90.2 kg    Physical Examination:  General: Moderately built, no acute distress noted Head ENT: Atraumatic normocephalic, PERRLA, neck supple Heart: S1-S2 heard, regular rate and rhythm, no murmurs.  No leg edema noted Lungs: Equal air entry bilaterally, no rhonchi or rales on exam, no accessory muscle use Abdomen: Bowel sounds reduced, moderately distended but not tense.  Laparoscopic surgical scars.   Extremities: No pedal edema.  No cyanosis or clubbing. Neurological: Awake alert oriented x3, no focal weakness or numbness, strength and sensations to crude touch intact Skin: No wounds or rashes.     Data Reviewed: I have personally reviewed following labs and imaging studies  CBC: Recent Labs  Lab 06/05/20 0901 06/05/20 0901 06/06/20 0524 06/07/20 0603 06/08/20 0604 06/09/20 0548 06/10/20 0623  WBC 21.0*   < > 14.1* 11.2* 14.5* 9.5 6.9  NEUTROABS 18.9*  --   --  9.4* 12.1* 7.1 4.9  HGB 13.5   < > 11.7* 11.6* 11.6* 12.1 12.9  HCT 40.2   < > 34.8* 35.0* 34.7* 36.7 39.0  MCV 95.9   < > 96.9 98.6 97.5 98.1 99.0  PLT 206   < > 257 315 322 436* 526*   < > = values  in this interval not displayed.   Basic Metabolic Panel: Recent Labs  Lab 06/06/20 0524 06/07/20 0603 06/08/20 0604 06/09/20 0548 06/10/20 0623  NA 134* 136 136 136 138  K 3.7 3.3* 4.0 3.5 3.6  CL 102 101 102 100 101  CO2 23 24 25 28 25   GLUCOSE 136* 89 119* 113* 146*  BUN 13 10 10 9 9   CREATININE 0.58 0.53 0.60 0.51 0.50  CALCIUM 8.2* 8.2* 8.5* 8.2* 8.2*  MG  --  2.1  --  2.0 2.2   GFR: Estimated Creatinine Clearance: 64.6 mL/min (by C-G formula based on SCr of 0.5 mg/dL). Liver Function Tests: Recent Labs  Lab 06/05/20 0901 06/06/20 0524 06/07/20 0603 06/08/20 0604 06/09/20 0548  AST 21 135* 64*  28 22  ALT 75* 134* 107* 75* 56*  ALKPHOS 103 223* 193* 155* 132*  BILITOT 1.2 2.8* 1.7* 1.1 0.9  PROT 6.9 6.4* 5.8* 5.8* 5.9*  ALBUMIN 3.4* 2.4* 2.3* 2.3* 2.5*   Recent Labs  Lab 06/05/20 0901  LIPASE 26   No results for input(s): AMMONIA in the last 168 hours. Coagulation Profile: Recent Labs  Lab 06/06/20 0524  INR 1.1   Cardiac Enzymes: No results for input(s): CKTOTAL, CKMB, CKMBINDEX, TROPONINI in the last 168 hours. BNP (last 3 results) No results for input(s): PROBNP in the last 8760 hours. HbA1C: No results for input(s): HGBA1C in the last 72 hours. CBG: No results for input(s): GLUCAP in the last 168 hours. Lipid Profile: No results for input(s): CHOL, HDL, LDLCALC, TRIG, CHOLHDL, LDLDIRECT in the last 72 hours. Thyroid Function Tests: No results for input(s): TSH, T4TOTAL, FREET4, T3FREE, THYROIDAB in the last 72 hours. Anemia Panel: No results for input(s): VITAMINB12, FOLATE, FERRITIN, TIBC, IRON, RETICCTPCT in the last 72 hours. Sepsis Labs: Recent Labs  Lab 06/05/20 1021  LATICACIDVEN 1.1    Recent Results (from the past 240 hour(s))  SARS Coronavirus 2 by RT PCR (hospital order, performed in [facility] hospital lab) Nasopharyngeal Nasopharyngeal Swab     Status: None   Collection Time: 06/02/20 10:02 AM   Specimen: Nasopharyngeal Swab  Result Value Ref Range Status   SARS Coronavirus 2 NEGATIVE NEGATIVE Final    Comment: (NOTE) SARS-CoV-2 target nucleic acids are NOT DETECTED.  The SARS-CoV-2 RNA is generally detectable in upper and lower respiratory specimens during the acute phase of infection. The lowest concentration of SARS-CoV-2 viral copies this assay can detect is 250 copies / mL. A negative result does not preclude SARS-CoV-2 infection and should not be used as the sole basis for treatment or other patient management decisions.  A negative result may occur with improper specimen collection / handling, submission of specimen other than  nasopharyngeal swab, presence of viral mutation(s) within the areas targeted by this assay, and inadequate number of viral copies (<250 copies / mL). A negative result must be combined with clinical observations, patient history, and epidemiological information.  Fact Sheet for Patients:   [facility]  Fact Sheet for Healthcare Providers: 08/02/20  This test is not yet approved or  cleared by the [employer] FDA and has been authorized for detection and/or diagnosis of SARS-CoV-2 by FDA under an Emergency Use Authorization (EUA).  This EUA will remain in effect (meaning this test can be used) for the duration of the COVID-19 declaration under Section 564(b)(1) of the Act, 21 U.S.C. section 360bbb-3(b)(1), unless the authorization is terminated or revoked sooner.  Performed at [facility], [address]  [place], [place] [address]   Culture, blood (routine x 2)     Status: None   Collection Time: 06/05/20 10:00 AM   Specimen: BLOOD  Result Value Ref Range Status   Specimen Description   Final    BLOOD LEFT ANTECUBITAL Performed at [facility], [address] [place], [place] [address]    Special Requests   Final    BOTTLES DRAWN AEROBIC AND ANAEROBIC Blood Culture adequate volume Performed at [facility] [place], [address] [place], [place] [address]    Culture   Final    NO GROWTH 5 DAYS Performed at [facility], [address] [place], [place] [address]    Report Status 06/10/2020 FINAL  Final  SARS Coronavirus 2 by RT PCR (hospital order, performed in [facility] hospital lab) Nasopharyngeal Nasopharyngeal Swab     Status: None   Collection Time: 06/05/20 10:21 AM   Specimen: Nasopharyngeal Swab  Result Value Ref Range Status   SARS Coronavirus 2 NEGATIVE NEGATIVE Final    Comment: (NOTE) SARS-CoV-2 target nucleic acids are NOT DETECTED.  The SARS-CoV-2 RNA is  generally detectable in upper and lower respiratory specimens during the acute phase of infection. The lowest concentration of SARS-CoV-2 viral copies this assay can detect is 250 copies / mL. A negative result does not preclude SARS-CoV-2 infection and should not be used as the sole basis for treatment or other patient management decisions.  A negative result may occur with improper specimen collection / handling, submission of specimen other than nasopharyngeal swab, presence of viral mutation(s) within the areas targeted by this assay, and inadequate number of viral copies (<250 copies / mL). A negative result must be combined with clinical observations, patient history, and epidemiological information.  Fact Sheet for Patients:   [employer]  Fact Sheet for Healthcare Providers: https://pope.com/  This test is not yet approved or  cleared by the [place] FDA and has been authorized for detection and/or diagnosis of SARS-CoV-2 by FDA under an Emergency Use Authorization (EUA).  This EUA will remain in effect (meaning this test can be used) for the duration of the COVID-19 declaration under Section 564(b)(1) of the Act, 21 U.S.C. section 360bbb-3(b)(1), unless the authorization is terminated or revoked sooner.  Performed at [facility], [address] [place], [place] [address]   Culture, blood (routine x 2)     Status: None   Collection Time: 06/05/20 10:30 AM   Specimen: BLOOD  Result Value Ref Range Status   Specimen Description   Final    BLOOD BLOOD LEFT HAND Performed at [facility], [address] [place], [place] [address]    Special Requests   Final    BOTTLES DRAWN AEROBIC AND ANAEROBIC Blood Culture adequate volume Performed at [facility], [address] [place], [place] [address]    Culture   Final    NO GROWTH 5 DAYS Performed at [facility], [address], [place], [place] [address]    Report Status 06/10/2020 FINAL  Final      Radiology Studies: DG Abd 2 Views  Result Date: 06/10/2020 CLINICAL DATA:  79 year old female with abdominal distension status post laparoscopic cholecystectomy. EXAM: ABDOMEN - 2 VIEW COMPARISON:  Abdominal radiograph dated 06/06/2019 and CT abdomen pelvis dated 06/02/2020. FINDINGS: There is severe diffuse gaseous dilatation of the:. The rectal vault measures approximately 12 cm in diameter. There  is air and fluid within the colon with air-fluid level. No definite dilated small bowel. No free air identified. Right upper quadrant cholecystectomy clips. Upper abdominal vascular calcification. Several surgical clips noted in the right hemipelvis. IMPRESSION: Severe diffuse gaseous dilatation of the colon. Electronically Signed   By: Elgie Collard M.D.   On: 06/10/2020 17:05   DG Abd Portable 1V  Result Date: 06/11/2020 CLINICAL DATA:  Abdominal pain and diarrhea. Recent cholecystectomy. EXAM: PORTABLE ABDOMEN - 1 VIEW COMPARISON:  Abdominal x-ray from yesterday. FINDINGS: Colonic dilatation in the mid to lower abdomen measuring up to 12 cm is unchanged. This is favored to represent the cecum. Colonic distension elsewhere has mildly improved. No dilated small bowel. Prior cholecystectomy. 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 IMPRESSION: 1. Overall mild improvement in colonic distention. Electronically Signed   By: Obie Dredge M.D.   On: 06/11/2020 13:29      Scheduled Meds: . amLODipine  10 mg Oral Daily  . citalopram  20 mg Oral Daily  . cycloSPORINE  1 drop Both Eyes BID  . guaiFENesin  1,200 mg Oral BID  . losartan  50 mg Oral Daily  . pantoprazole  40 mg Oral Daily  . polyethylene glycol  17 g Oral BID   Continuous Infusions: . sodium chloride Stopped (06/05/20 1526)   Assessment/Plan:  1.  Acute emphysematous cholecystitis:.  Patient on admission had  leukocytosis, worsening transaminitis.  Initially HIDA scan was ordered but could not be obtained due to lack of tracer.  Acute hepatitis panel was negative.  GI was consulted and MRCP was obtained which showed acute emphysematous cholecystitis.  General surgery was subsequently consulted and patient underwent laparoscopic cholecystectomy with intraoperative cholangiogram on 8/7 by Dr. Gerrit Friends.  Patient tolerated procedure well.  Drain removed on 8/10.  Diet advanced to regular diet.  Patient however was complaining of bloating with constipation and minimal response to laxatives.  Blood cultures from 8/5 - ve. leukocytosis resolved.  Antibiotics discontinued on 8/10  2.  Abdominal distention/ileus: Due to above complaints, abdominal x-ray was obtained which showed significant colonic distention/ileus/stool burden.  General surgery recommended clear liquid diet, laxatives.  Patient states she had BM yesterday but still feels somewhat bloated.  Diet advanced to full liquid diet today.  She is out of bed to chair.  Repeat x-ray planned for a.m. DC Dilaudid and oxycodone to avoid opiate-induced distention.  3.  Transient hypoxia: Present on admission likely secondary to atelectasis and poor inspiratory effort in the setting of right upper quadrant pain.  She has isolated hypoxia of 85% documented on room air on presentation.  She has otherwise been saturating 90 to 93% on room air since then.  Continue incentive spirometry.  4.  Constipation: On multiple laxatives including MiraLAX daily, also received Dulcolax suppository/sorbitol/mag citrate/mineral oil enema with minimal results.  She however did respond to Dulcolax suppository and had a good BM earlier today according to nurse. Repeat abdominal x-ray today showed-Colonic dilatation in the mid to lower abdomen measuring up to 12 cm is unchanged. This is favored to represent the cecum. Colonic distension elsewhere has mildly improved .   5. Hypertension:  Continue Norvasc 10 mg, Cozaar 50 mg.  Will increase Cozaar dose as BP still elevated.  6.  Anxiety disorder/insomnia: Continue Elavil and Celexa   7.  Hyperlipidemia: Resume statins at discharge.  8.  Borderline diabetes: Patient noted to have mildly elevated blood glucose levels during the hospital course.  Hemoglobin A1c at 6.3.  Follow-up PCP as outpatient for further recommendations.  9.  GERD: PPI   DVT prophylaxis: Lovenox Code Status: Full code Family / Patient Communication: Discussed with patient Disposition Plan:   Status is: Inpatient  Remains inpatient appropriate because:IV treatments appropriate due to intensity of illness or inability to take PO   Dispo: The patient is from: Home              Anticipated d/c is to: Home              Anticipated d/c date is: 1-2 days              Patient currently is not medically stable to d/c.           Time spent: 35 mins    >50% time spent in discussions with care team and coordination of care.    NeelimaAlessandra Bevels Chancy Smigiel, MD [facility] Pager in [place]  If 7PM-7AM, please contact night-coverage [email or web address] 06/11/2020, 4:17 PM

## 2020-06-11 NOTE — Progress Notes (Signed)
Physical Therapy Discharge Patient Details Name: Margaret Cross MRN: [id number] DOB: [birth year] Today's Date: 06/11/2020 Time:  -     Patient discharged from PT services secondary to goals met and no further PT needs identified.Patient ambulating with family. Please see latest therapy progress note for current level of functioning and progress toward goals.    Progress and discharge plan discussed with patient and/or caregiver:yes GP     [name] 06/11/2020, 3:42 PM Tresa Endo PT Acute Rehabilitation Services Pager [phone] Office [phone]

## 2020-06-12 DIAGNOSIS — R0902 Hypoxemia: Secondary | ICD-10-CM

## 2020-06-12 DIAGNOSIS — J9811 Atelectasis: Secondary | ICD-10-CM

## 2020-06-12 MED ORDER — DULCOLAX 5 MG PO TBEC: 5.0000 mg | DELAYED_RELEASE_TABLET | Freq: Every day | ORAL | 1 refills | Status: AC | PRN

## 2020-06-12 MED ORDER — POLYETHYLENE GLYCOL 3350 17 G PO PACK: 17.0000 g | PACK | Freq: Every day | ORAL | 0 refills | Status: AC

## 2020-06-12 MED ORDER — ENOXAPARIN SODIUM 40 MG/0.4ML ~~LOC~~ SOLN
40.0000 mg | Freq: Every day | SUBCUTANEOUS | Status: DC
  Administered 2020-06-12: 40 mg via SUBCUTANEOUS
  Filled 2020-06-12: qty 0.4

## 2020-06-12 MED ORDER — LOSARTAN POTASSIUM 50 MG PO TABS: 75.0000 mg | ORAL_TABLET | Freq: Every day | ORAL | 0 refills | Status: AC

## 2020-06-12 NOTE — Discharge Summary (Addendum)
Physician Discharge Summary  [place] Cross TWS:[id number] DOB: [birth year] DOA: 06/05/2020  PCP: Harold Barban, MD  Admit date: 06/05/2020 Discharge date: 06/12/2020 Consultations: General surgery Admitted From: home Disposition: home  Discharge Diagnoses:  Principal Problem:   Acute emphysematous cholecystitis Active Problems:   RUQ pain   Transaminitis   Essential hypertension   Anxiety   Hyperglycemia   Hypokalemia   Constipation   Hospital Course Summary:   79 y.o.femalewith a past medical history of anxiety disorder, essential hypertension who was in her usual state of health till about 2 weeks ago when she noticed that shehad lost herappetite. She felt fatigued. She also had bouts of nausea and vomiting about a week ago. And then about 3 days prior to this admission patient started developing abdominal pain in the right upper quadrant along with some nausea. She went to the emergency department and was evaluated and was noted to have abnormal LFTs. A CT abdomen raised concern for inflammation in the gallbladder. Abdominal ultrasound however did not show any acute cholecystitis. Patient was discharged home. She came back to the emergency department due to persistent symptoms. She has also developed some shortness of breath on walking. She has chronic acid reflux and has a chronic cough as a result of the same.A CT angiogram was done which did not show any PE but raised concern for possible infectious process in the lung. It was felt that perhaps this was causing her abdominal symptoms. Patient admitted to [facility] service.  GI was consulted and patient seen in consultation by Dr. Christella Hartigan who recommended MRCP (HIDA scan could not be done due to lack of tracer).  1.  Acute emphysematous cholecystitis:.  Patient on admission had leukocytosis, worsening transaminitis.  Initially HIDA scan was ordered but could not be obtained due to lack of tracer.  Acute hepatitis panel was  negative.  GI was consulted and MRCP was obtained which showed acute emphysematous cholecystitis.  General surgery was subsequently consulted and patient underwent laparoscopic cholecystectomy with intraoperative cholangiogram on 8/7 by Dr. Gerrit Friends.  Patient tolerated procedure well.  Drain removed on 8/10.  Diet advanced to regular diet.  Patient however was complaining of bloating with constipation and minimal response to laxatives.  Blood cultures from 8/5 - ve. leukocytosis resolved.  Antibiotics discontinued on 8/10. GS cleared for discharge and have issues scripts for oxycodone prn use. F/U GS clinic in a week  2.  Abdominal distention/ileus: Due to above complaints, abdominal x-ray was obtained which showed significant colonic distention/ileus/stool burden.  General surgery recommended clear liquid diet, laxatives.  Patient states she had BM yesterday but still feels somewhat bloated.  Diet advanced to full liquid diet today.  She is out of bed to chair.  Repeat x-ray planned for a.m. DC Dilaudid and oxycodone to avoid opiate-induced distention.  3.  Transient hypoxia: Present on admission likely secondary to atelectasis and poor inspiratory effort in the setting of right upper quadrant pain. Pneumonia ruled out. She has isolated hypoxia of 85% documented on room air on presentation .  She has otherwise been saturating 90 to 93% on room air since then.  Does not feel dyspneic now and no cough.Continue incentive spirometry upon discharge.  4.  Constipation: On multiple laxatives including MiraLAX daily, also received Dulcolax suppository/sorbitol/mag citrate/mineral oil enema with minimal results. She however did respond to Dulcolax suppository and had a good BM yesterday and earlier today according to nurse. Repeat abdominal x-ray 8/11 showed-Colonic dilatation in the mid to lower abdomen  measuring up to 12 cm is unchanged. This is favored to represent the cecum. Colonic distension elsewhere has  mildly improved .  Patient was advanced on diet slowly and watched overnight, doing well, had BM and anxious to eat regular food--ok per GS to d/c home if tolerates regular diet. Can use dulcolax prn and Miralax daily upon discharge. Advised to minimize opiate use.   5. Hypertension: Continue Norvasc 10 mg, Cozaar 50 mg.  Increased Cozaar to 75 mg as BP still elevated.  6.  Anxiety disorder/insomnia: Continue Elavil and Celexa   7.  Hyperlipidemia: Resume statins at discharge.  8.  Borderline diabetes: Patient noted to have mildly elevated blood glucose levels during the hospital course.  Hemoglobin A1c at 6.3.  Follow-up PCP as outpatient for further recommendations.  9.  GERD: PPI   Discharge Exam:   Vitals:   06/11/20 1203 06/11/20 2038 06/12/20 0547 06/12/20 1357  BP: (!) 151/89 (!) 160/89 (!) 153/84 (!) 148/80  Pulse: 96 92 84 93  Resp: 17 18 16 17   Temp: 97.9 F (36.6 C) 98.2 F (36.8 C) 97.7 F (36.5 C) (!) 97.5 F (36.4 C)  TempSrc: Oral Oral Oral Oral  SpO2: 93% 92% 92% 93%  Weight:      Height:        General: Pt is alert, awake, not in acute distress Cardiovascular: RRR, S1/S2 +, no rubs, no gallops Respiratory: CTA bilaterally, no wheezing, no rhonchi Abdominal: Soft, NT,improved distension, laparoscopic site clean, bowel sounds + Extremities: no edema, no cyanosis  Discharge Condition:Stable CODE STATUS: Full code Diet recommendation: heart healthy, carb controlled Recommendations for Outpatient Follow-up:  1. Follow up with PCP: 5 days 2. Follow up with consultants: Gen surgery in 5 days 3. Please obtain follow up labs including: CBC/BMP   Home Health services upon discharge: none Equipment/Devices upon discharge:none   Discharge Instructions:  Discharge Instructions    Call MD for:  difficulty breathing, headache or visual disturbances   Complete by: As directed    Call MD for:  extreme fatigue   Complete by: As directed    Call MD for:   persistant dizziness or light-headedness   Complete by: As directed    Call MD for:  persistant nausea and vomiting   Complete by: As directed    Call MD for:  temperature >100.4   Complete by: As directed    Diet - low sodium heart healthy   Complete by: As directed    Increase activity slowly   Complete by: As directed    No wound care   Complete by: As directed      Allergies as of 06/12/2020      Reactions   Bupropion Other (See Comments)   Headache and hand tremors   Sulfa Antibiotics Nausea And Vomiting   rash Other reaction(s): Other (See Comments) unknown unknown      Medication List    TAKE these medications   acetaminophen 325 MG tablet Commonly known as: TYLENOL You can take 2 tablets every 4 hours as needed for pain.  You can alternate this with ibuprofen.  Do not take more than 4000 mg of Tylenol per day.  You can buy this over-the-counter at any drugstore. What changed:   medication strength  how much to take  how to take this  when to take this  reasons to take this  additional instructions   amitriptyline 25 MG tablet Commonly known as: ELAVIL Take 25 mg by mouth at  bedtime as needed for sleep.   amLODipine 10 MG tablet Commonly known as: NORVASC Take 10 mg by mouth daily.   calcium carbonate 500 MG chewable tablet Commonly known as: TUMS - dosed in mg elemental calcium Chew 1 tablet by mouth 2 (two) times daily as needed for indigestion or heartburn.   citalopram 40 MG tablet Commonly known as: CELEXA Take 40 mg by mouth daily.   Dexilant 60 MG capsule Generic drug: dexlansoprazole Take 60 mg by mouth daily.   Dulcolax 5 MG EC tablet Generic drug: bisacodyl Take 1-2 tablets (5-10 mg total) by mouth daily as needed for moderate constipation.   famotidine 40 MG tablet Commonly known as: PEPCID Take 40 mg by mouth daily as needed for heartburn or indigestion.   ibuprofen 200 MG tablet Commonly known as: ADVIL You can take 2  tablets every 6 hours as needed for pain.  You can alternate with plain Tylenol/acetaminophen.  You can buy this over-the-counter at any drugstore.   losartan 50 MG tablet Commonly known as: COZAAR Take 1.5 tablets (75 mg total) by mouth daily. What changed: how much to take   ondansetron 4 MG disintegrating tablet Commonly known as: Zofran ODT 4mg  ODT q4 hours prn nausea/vomit   oxyCODONE 5 MG immediate release tablet Commonly known as: Oxy IR/ROXICODONE You can take 1 tablet every 6 hours as needed for pain not relieved by Tylenol and ibuprofen.   polyethylene glycol 17 g packet Commonly known as: MIRALAX / GLYCOLAX Take 17 g by mouth daily. Hold for diarrhea   Restasis 0.05 % ophthalmic emulsion Generic drug: cycloSPORINE Place 1 drop into both eyes 2 (two) times daily.   rosuvastatin 5 MG tablet Commonly known as: CRESTOR Take 5 mg by mouth as directed. Take on MWF       Follow-up Information    [facility] Follow up on 07/01/2020.   Specialty: General Surgery Why: your appointment is at 08:30 AM.  Be at the office 30 minutes early for check in.  Bring photo ID and insurance information.   Contact information: [address] [place] [address] [phone]        Harold Barban, MD Follow up.   Specialty: Internal Medicine Why: Let them know you had surgery. Follow up for medical issues. Contact information: [address] [place] [address] [phone]              Allergies  Allergen Reactions  . Bupropion Other (See Comments)    Headache and hand tremors  . Sulfa Antibiotics Nausea And Vomiting    rash Other reaction(s): Other (See Comments) unknown unknown       The results of significant diagnostics from this hospitalization (including imaging, microbiology, ancillary and laboratory) are listed below for reference.    Labs: BNP (last 3 results) No results for input(s): BNP in the last  8760 hours. Basic Metabolic Panel: Recent Labs  Lab 06/06/20 0524 06/07/20 0603 06/08/20 0604 06/09/20 0548 06/10/20 0623  NA 134* 136 136 136 138  K 3.7 3.3* 4.0 3.5 3.6  CL 102 101 102 100 101  CO2 23 24 25 28 25   GLUCOSE 136* 89 119* 113* 146*  BUN 13 10 10 9 9   CREATININE 0.58 0.53 0.60 0.51 0.50  CALCIUM 8.2* 8.2* 8.5* 8.2* 8.2*  MG  --  2.1  --  2.0 2.2   Liver Function Tests: Recent Labs  Lab 06/06/20 0524 06/07/20 0603 06/08/20  0604 06/09/20 0548  AST 135* 64* 28 22  ALT 134* 107* 75* 56*  ALKPHOS 223* 193* 155* 132*  BILITOT 2.8* 1.7* 1.1 0.9  PROT 6.4* 5.8* 5.8* 5.9*  ALBUMIN 2.4* 2.3* 2.3* 2.5*   No results for input(s): LIPASE, AMYLASE in the last 168 hours. No results for input(s): AMMONIA in the last 168 hours. CBC: Recent Labs  Lab 06/06/20 0524 06/07/20 0603 06/08/20 0604 06/09/20 0548 06/10/20 0623  WBC 14.1* 11.2* 14.5* 9.5 6.9  NEUTROABS  --  9.4* 12.1* 7.1 4.9  HGB 11.7* 11.6* 11.6* 12.1 12.9  HCT 34.8* 35.0* 34.7* 36.7 39.0  MCV 96.9 98.6 97.5 98.1 99.0  PLT 257 315 322 436* 526*   Cardiac Enzymes: No results for input(s): CKTOTAL, CKMB, CKMBINDEX, TROPONINI in the last 168 hours. BNP: Invalid input(s): POCBNP CBG: No results for input(s): GLUCAP in the last 168 hours. D-Dimer No results for input(s): DDIMER in the last 72 hours. Hgb A1c No results for input(s): HGBA1C in the last 72 hours. Lipid Profile No results for input(s): CHOL, HDL, LDLCALC, TRIG, CHOLHDL, LDLDIRECT in the last 72 hours. Thyroid function studies No results for input(s): TSH, T4TOTAL, T3FREE, THYROIDAB in the last 72 hours.  Invalid input(s): FREET3 Anemia work up No results for input(s): VITAMINB12, FOLATE, FERRITIN, TIBC, IRON, RETICCTPCT in the last 72 hours. Urinalysis    Component Value Date/Time   COLORURINE YELLOW 06/05/2020 1140   APPEARANCEUR CLEAR 06/05/2020 1140   LABSPEC <1.005 (L) 06/05/2020 1140   PHURINE 6.5 06/05/2020 1140   GLUCOSEU  NEGATIVE 06/05/2020 1140   HGBUR NEGATIVE 06/05/2020 1140   BILIRUBINUR MODERATE (A) 06/05/2020 1140   KETONESUR NEGATIVE 06/05/2020 1140   PROTEINUR 30 (A) 06/05/2020 1140   NITRITE NEGATIVE 06/05/2020 1140   LEUKOCYTESUR NEGATIVE 06/05/2020 1140   Sepsis Labs Invalid input(s): PROCALCITONIN,  WBC,  LACTICIDVEN Microbiology Recent Results (from the past 240 hour(s))  Culture, blood (routine x 2)     Status: None   Collection Time: 06/05/20 10:00 AM   Specimen: BLOOD  Result Value Ref Range Status   Specimen Description   Final    BLOOD LEFT ANTECUBITAL Performed at [facility], [address] [place], [place] [address]    Special Requests   Final    BOTTLES DRAWN AEROBIC AND ANAEROBIC Blood Culture adequate volume Performed at [facility], [address] [place], [place] [address]    Culture   Final    NO GROWTH 5 DAYS Performed at [facility], [address] [place], [place] [address]    Report Status 06/10/2020 FINAL  Final  SARS Coronavirus 2 by RT PCR (hospital order, performed in [facility] hospital lab) Nasopharyngeal Nasopharyngeal Swab     Status: None   Collection Time: 06/05/20 10:21 AM   Specimen: Nasopharyngeal Swab  Result Value Ref Range Status   SARS Coronavirus 2 NEGATIVE NEGATIVE Final    Comment: (NOTE) SARS-CoV-2 target nucleic acids are NOT DETECTED.  The SARS-CoV-2 RNA is generally detectable in upper and lower respiratory specimens during the acute phase of infection. The lowest concentration of SARS-CoV-2 viral copies this assay can detect is 250 copies / mL. A negative result does not preclude SARS-CoV-2 infection and should not be used as the sole basis for treatment or other patient management decisions.  A negative result may occur with improper specimen collection / handling, submission of specimen other than nasopharyngeal swab, presence of viral mutation(s) within the areas  targeted by this assay, and  inadequate number of viral copies (<250 copies / mL). A negative result must be combined with clinical observations, patient history, and epidemiological information.  Fact Sheet for Patients:   BoilerBrush.com.cy  Fact Sheet for Healthcare Providers: https://pope.com/  This test is not yet approved or  cleared by the [place] FDA and has been authorized for detection and/or diagnosis of SARS-CoV-2 by FDA under an Emergency Use Authorization (EUA).  This EUA will remain in effect (meaning this test can be used) for the duration of the COVID-19 declaration under Section 564(b)(1) of the Act, 21 U.S.C. section 360bbb-3(b)(1), unless the authorization is terminated or revoked sooner.  Performed at [facility], [address] [place], [place] [address]   Culture, blood (routine x 2)     Status: None   Collection Time: 06/05/20 10:30 AM   Specimen: BLOOD  Result Value Ref Range Status   Specimen Description   Final    BLOOD BLOOD LEFT HAND Performed at [facility], [address] [place], [place] [address]    Special Requests   Final    BOTTLES DRAWN AEROBIC AND ANAEROBIC Blood Culture adequate volume Performed at [facility], [address] [place], [place] [address]    Culture   Final    NO GROWTH 5 DAYS Performed at [facility], [address] [place], [place] [address]    Report Status 06/10/2020 FINAL  Final    Procedures/Studies: DG Chest 2 View  Result Date: 06/02/2020 CLINICAL DATA:  Nausea and vomiting EXAM: CHEST - 2 VIEW COMPARISON:  Same day CT FINDINGS: The cardiomediastinal silhouette is normal in contour with a tortuous thoracic aorta.Atherosclerotic calcifications of the aorta. No pleural effusion. No pneumothorax. RIGHT basilar heterogeneous opacity. Hiatal hernia. Visualized abdomen is unremarkable. Multilevel degenerative changes of the thoracic spine. IMPRESSION:  RIGHT basilar heterogeneous opacity. Differential considerations include atelectasis, aspiration or infection. Electronically Signed   By: Meda Klinefelter MD   On: 06/02/2020 14:12   DG Cholangiogram Operative  Result Date: 06/07/2020 CLINICAL DATA:  Cholecystectomy for emphysematous cholecystitis. EXAM: INTRAOPERATIVE CHOLANGIOGRAM TECHNIQUE: Cholangiographic images from the C-arm fluoroscopic device were submitted for interpretation post-operatively. Please see the procedural report for the amount of contrast and the fluoroscopy time utilized. COMPARISON:  MRI of the abdomen on 06/06/2020 FINDINGS: Intraoperative imaging demonstrates a patent cystic duct. The common hepatic and common bile ducts are patent and of normal caliber without evidence of filling defect. Contrast enters the duodenum. IMPRESSION: Unremarkable intraoperative cholangiogram. Electronically Signed   By: Irish Lack M.D.   On: 06/07/2020 13:10   CT Angio Chest PE W and/or Wo Contrast  Result Date: 06/05/2020 CLINICAL DATA:  Right upper quadrant pain EXAM: CT ANGIOGRAPHY CHEST WITH CONTRAST TECHNIQUE: Multidetector CT imaging of the chest was performed using the standard protocol during bolus administration of intravenous contrast. Multiplanar CT image reconstructions and MIPs were obtained to evaluate the vascular anatomy. CONTRAST:  OMNIPAQUE IOHEXOL 350 MG/ML SOLN COMPARISON:  None. FINDINGS: Cardiovascular: No filling defects in the pulmonary arteries to suggest pulmonary emboli. Aortic atherosclerosis and coronary artery disease. Heart is normal size. Aorta normal caliber. Mediastinum/Nodes: No mediastinal, hilar, or axillary adenopathy. Moderate-sized hiatal hernia. Thyroid unremarkable. Lungs/Pleura: Bilateral lower lobe airspace opacities, right greater than left could reflect atelectasis or infiltrate/pneumonia. Trace right pleural effusion. Upper Abdomen: Calcifications in the spleen compatible with old granulomatous  disease. Several small calcified splenic artery aneurysms  measuring up to 12 mm. No acute findings. Musculoskeletal: Chest wall soft tissues are unremarkable. Mild compression fracture through the superior endplate of a lower thoracic vertebral body, likely T10. Review of the MIP images confirms the above findings. IMPRESSION: No evidence of pulmonary embolus. Bilateral lower lobe airspace opacities, right greater than left which could reflect atelectasis or pneumonia. Trace right pleural effusion. Coronary artery disease. 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 Moderate-sized hiatal hernia. Mild compression fracture through the superior endplate at T10, age indeterminate. Aortic Atherosclerosis (ICD10-I70.0). Electronically Signed   By: Charlett Nose M.D.   On: 06/05/2020 10:34   CT ABDOMEN PELVIS W CONTRAST  Result Date: 06/02/2020 CLINICAL DATA:  Acute upper abdominal pain. EXAM: CT ABDOMEN AND PELVIS WITH CONTRAST TECHNIQUE: Multidetector CT imaging of the abdomen and pelvis was performed using the standard protocol following bolus administration of intravenous contrast. CONTRAST:  OMNIPAQUE IOHEXOL 300 MG/ML  SOLN COMPARISON:  Ultrasound of same day. FINDINGS: Lower chest: Moderate size sliding-type hiatal hernia is noted. Visualized lung bases are unremarkable. Hepatobiliary: No focal liver abnormality is seen. No gallstones or biliary dilatation. Mild inflammatory changes appear to be present around the gallbladder. Pancreas: Unremarkable. No pancreatic ductal dilatation or surrounding inflammatory changes. Spleen: Calcified splenic granulomata are noted. Possible 9 mm calcified distal splenic artery aneurysm. Adrenals/Urinary Tract: Adrenal glands are unremarkable. Kidneys are normal, without renal calculi, focal lesion, or hydronephrosis. Bladder is unremarkable. Stomach/Bowel: There is no evidence of bowel obstruction or  inflammation. The appendix is unremarkable. Moderate size sliding-type hiatal hernia is again noted. Vascular/Lymphatic: Aortic atherosclerosis. No enlarged abdominal or pelvic lymph nodes. Reproductive: Status post hysterectomy. No adnexal masses. Other: No abdominal wall hernia or abnormality. No abdominopelvic ascites. Musculoskeletal: No acute or significant osseous findings. IMPRESSION: 1. Moderate size sliding-type hiatal hernia. 2. Mild inflammatory changes appear to be present around the gallbladder. No gallstones or biliary dilatation is noted. HIDA scan may be performed for further evaluation. 3. Possible 9 mm calcified distal splenic artery aneurysm. Aortic Atherosclerosis (ICD10-I70.0). Electronically Signed   By: Lupita Raider M.D.   On: 06/02/2020 13:29   MR 3D Recon At Scanner  Addendum Date: 06/06/2020   ADDENDUM REPORT: 06/06/2020 21:31 ADDENDUM: These results were called by telephone on 06/06/2020 at 9:31 pm to provider Dr Toniann Fail, who verbally acknowledged these results. Electronically Signed   By: Kreg Shropshire M.D.   On: 06/06/2020 21:31   Result Date: 06/06/2020 CLINICAL DATA:  Right upper quadrant pain, nondiagnostic ultrasound, elevated LFTs EXAM: MRI ABDOMEN WITHOUT AND WITH CONTRAST (INCLUDING MRCP) TECHNIQUE: Multiplanar multisequence MR imaging of the abdomen was performed both before and after the administration of intravenous contrast. Heavily T2-weighted images of the biliary and pancreatic ducts were obtained, and three-dimensional MRCP images were rendered by post processing. CONTRAST:  7.39mL GADAVIST GADOBUTROL 1 MMOL/ML IV SOLN COMPARISON:  CT 06/02/2020, CT angio chest 06/05/2020 FINDINGS: Lower chest: There are small bilateral effusions, right slightly greater than left with adjacent areas of atelectatic lung which appear fairly uniform in signal intensity though underlying infection is not fully excluded. Cardiac size upper limits normal Hepatobiliary: No focal liver  lesions are evident. The liver surface is smooth. Normal intrinsic liver signal without significant dropout on out of phase imaging to suggest fatty infiltration. There is however some mild hyperemic enhancement adjacent the gallbladder fossa which is likely reactive to the gallbladder inflammation. Additional mildly increased T2 signal about the biliary tree  likely reflecting some periportal edema or inflammation. The gallbladder itself is markedly distended with a mixture of tumefactive sludge and more normal bile which is best seen on the source images and does extend into the normally tapering distal bile duct. There is diffuse gallbladder wall thickening and significant enhancement as well as bubbly foci T2 signal dropout towards the gallbladder fundus and within the gallbladder fossa concerning for emphysematous cholecystitis. Extensive surrounding T2 hyperintense inflammatory changes are present. Pancreas: Mild pancreatic atrophy. No visible enhancing or hypoenhancing lesions within the pancreas. No pancreatic ductal dilatation. Spleen: Normal in size. No concerning splenic lesions. Foci of signal dropout towards the splenic hilum compatible with calcifications seen on comparison is. Including a larger focus likely reflecting the suspected splenic artery aneurysm. Adrenals/Urinary Tract: Mild thickening of the left adrenal gland without discernible worrisome nodular mass compatible with adrenal hyperplasia. Mild bilateral perinephric stranding, nonspecific. Bilateral extrarenal pelves as well as several parapelvic renal cysts. No concerning renal lesions. Stomach/Bowel: Some likely reactive edematous changes about the hepatic flexure which closely abuts the distended tip of the gallbladder. Vascular/Lymphatic: Calcifications towards the splenic hilum compatible with splenic artery aneurysm seen on comparison CT. No other major vascular abnormalities. No worrisome adenopathy. Other: Inflammatory changes about  the gallbladder and right upper quadrant. Mild body wall edema. Emphysematous changes appear confined to the bladder wall without evidence of free air. Musculoskeletal: No concerning marrow signal abnormalities or abnormal cord signal. Degenerative changes are present throughout the spine without convincing acute osseous injury. These include some Modic type endplate changes multiple levels of spine and anterior thoracic disc spaces. IMPRESSION: 1. Gallbladder demonstrating features of acute emphysematous cholecystitis and containing a large volume of tumefactive sludge extending into the proximal cystic duct but without distal common bile duct stone. Extensive surrounding inflammatory changes and hyperemic hyperenhancement of the adjacent liver parenchyma along the gallbladder fossa. Some mild adjacent inflammatory changes upon the hepatic flexure as well. 2. There is diffusely increased T2 signal about the biliary tree which could reflect some periportal edema or inflammation/cholangitis. Correlate with clinical findings and laboratory values. 3. Small bilateral effusions, right slightly greater than left with adjacent areas of atelectatic lung. Underlying infection is not fully excluded. Currently attempting to contact the ordering provider with a critical value result. Addendum will be submitted upon case discussion. Electronically Signed: By: Kreg Shropshire M.D. On: 06/06/2020 21:20   DG Chest Port 1 View  Result Date: 06/05/2020 CLINICAL DATA:  RIGHT upper quadrant pain EXAM: PORTABLE CHEST 1 VIEW COMPARISON:  June 02, 2020 FINDINGS: The cardiomediastinal silhouette is unchanged in contour.Elevation of the RIGHT hemidiaphragm. No pleural effusion. No pneumothorax. Increased RIGHT middle lobe linear opacity. Increased LEFT lower lung predominately linear opacity. Visualized abdomen is unremarkable. Degenerative changes of bilateral shoulders. IMPRESSION: Increased RIGHT middle lobe and LEFT lower lung  predominately linear opacities, favored to represent atelectasis. Superimposed infection or aspiration remains in the differential. Electronically Signed   By: Meda Klinefelter MD   On: 06/05/2020 09:38   DG Abd 2 Views  Result Date: 06/10/2020 CLINICAL DATA:  79 year old female with abdominal distension status post laparoscopic cholecystectomy. EXAM: ABDOMEN - 2 VIEW COMPARISON:  Abdominal radiograph dated 06/06/2019 and CT abdomen pelvis dated 06/02/2020. FINDINGS: There is severe diffuse gaseous dilatation of the:. The rectal vault measures approximately 12 cm in diameter. There is air and fluid within the colon with air-fluid level. No definite dilated small bowel. No free air identified. Right upper quadrant cholecystectomy clips. Upper abdominal vascular calcification.  Several surgical clips noted in the right hemipelvis. IMPRESSION: Severe diffuse gaseous dilatation of the colon. Electronically Signed   By: Elgie CollardArash  Radparvar M.D.   On: 06/10/2020 17:05   DG Abd Portable 1V  Result Date: 06/11/2020 CLINICAL DATA:  Abdominal pain and diarrhea. Recent cholecystectomy. EXAM: PORTABLE ABDOMEN - 1 VIEW COMPARISON:  Abdominal x-ray from yesterday. FINDINGS: Colonic dilatation in the mid to lower abdomen measuring up to 12 cm is unchanged. This is favored to represent the cecum. Colonic distension elsewhere has mildly improved. No dilated small bowel. Prior cholecystectomy. 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 IMPRESSION: 1. Overall mild improvement in colonic distention. Electronically Signed   By: Obie DredgeWilliam T Derry M.D.   On: 06/11/2020 13:29   MR ABDOMEN MRCP W WO CONTAST  Addendum Date: 06/06/2020   ADDENDUM REPORT: 06/06/2020 21:31 ADDENDUM: These results were called by telephone on 06/06/2020 at 9:31 pm to provider Dr Toniann FailKakrakandy, who verbally acknowledged these results. Electronically Signed   By: Kreg ShropshirePrice  DeHay M.D.   On: 06/06/2020 21:31   Result Date:  06/06/2020 CLINICAL DATA:  Right upper quadrant pain, nondiagnostic ultrasound, elevated LFTs EXAM: MRI ABDOMEN WITHOUT AND WITH CONTRAST (INCLUDING MRCP) TECHNIQUE: Multiplanar multisequence MR imaging of the abdomen was performed both before and after the administration of intravenous contrast. Heavily T2-weighted images of the biliary and pancreatic ducts were obtained, and three-dimensional MRCP images were rendered by post processing. CONTRAST:  7.285mL GADAVIST GADOBUTROL 1 MMOL/ML IV SOLN COMPARISON:  CT 06/02/2020, CT angio chest 06/05/2020 FINDINGS: Lower chest: There are small bilateral effusions, right slightly greater than left with adjacent areas of atelectatic lung which appear fairly uniform in signal intensity though underlying infection is not fully excluded. Cardiac size upper limits normal Hepatobiliary: No focal liver lesions are evident. The liver surface is smooth. Normal intrinsic liver signal without significant dropout on out of phase imaging to suggest fatty infiltration. There is however some mild hyperemic enhancement adjacent the gallbladder fossa which is likely reactive to the gallbladder inflammation. Additional mildly increased T2 signal about the biliary tree likely reflecting some periportal edema or inflammation. The gallbladder itself is markedly distended with a mixture of tumefactive sludge and more normal bile which is best seen on the source images and does extend into the normally tapering distal bile duct. There is diffuse gallbladder wall thickening and significant enhancement as well as bubbly foci T2 signal dropout towards the gallbladder fundus and within the gallbladder fossa concerning for emphysematous cholecystitis. Extensive surrounding T2 hyperintense inflammatory changes are present. Pancreas: Mild pancreatic atrophy. No visible enhancing or hypoenhancing lesions within the pancreas. No pancreatic ductal dilatation. Spleen: Normal in size. No concerning splenic  lesions. Foci of signal dropout towards the splenic hilum compatible with calcifications seen on comparison is. Including a larger focus likely reflecting the suspected splenic artery aneurysm. Adrenals/Urinary Tract: Mild thickening of the left adrenal gland without discernible worrisome nodular mass compatible with adrenal hyperplasia. Mild bilateral perinephric stranding, nonspecific. Bilateral extrarenal pelves as well as several parapelvic renal cysts. No concerning renal lesions. Stomach/Bowel: Some likely reactive edematous changes about the hepatic flexure which closely abuts the distended tip of the gallbladder. Vascular/Lymphatic: Calcifications towards the splenic hilum compatible with splenic artery aneurysm seen on comparison CT. No other major vascular abnormalities. No worrisome adenopathy. Other: Inflammatory changes about the gallbladder and right upper quadrant. Mild body wall edema. Emphysematous changes appear confined to the bladder wall without evidence of free air. Musculoskeletal: No concerning marrow  signal abnormalities or abnormal cord signal. Degenerative changes are present throughout the spine without convincing acute osseous injury. These include some Modic type endplate changes multiple levels of spine and anterior thoracic disc spaces. IMPRESSION: 1. Gallbladder demonstrating features of acute emphysematous cholecystitis and containing a large volume of tumefactive sludge extending into the proximal cystic duct but without distal common bile duct stone. Extensive surrounding inflammatory changes and hyperemic hyperenhancement of the adjacent liver parenchyma along the gallbladder fossa. Some mild adjacent inflammatory changes upon the hepatic flexure as well. 2. There is diffusely increased T2 signal about the biliary tree which could reflect some periportal edema or inflammation/cholangitis. Correlate with clinical findings and laboratory values. 3. Small bilateral effusions, right  slightly greater than left with adjacent areas of atelectatic lung. Underlying infection is not fully excluded. Currently attempting to contact the ordering provider with a critical value result. Addendum will be submitted upon case discussion. Electronically Signed: By: Kreg Shropshire M.D. On: 06/06/2020 21:20   US Abdomen Limited RUQ  Result Date: 06/02/2020 CLINICAL DATA:  79 year old female with a history of upper abdominal pain EXAM: ULTRASOUND ABDOMEN LIMITED RIGHT UPPER QUADRANT COMPARISON:  None. FINDINGS: Gallbladder: No gallstones or wall thickening visualized. No sonographic Murphy sign noted by sonographer. Common bile duct: Diameter: 4 mm Liver: Heterogeneous echogenicity of the liver, relatively increased. Focal fatty sparing adjacent to the gallbladder. No focal lesion. Portal vein is patent on color Doppler imaging with normal direction of blood flow towards the liver. Other: None. IMPRESSION: Sonographic survey negative for cholelithiasis or sonographic evidence of acute cholecystitis. Liver steatosis. Electronically Signed   By: Gilmer Mor D.O.   On: 06/02/2020 10:46    Time coordinating discharge: Over 30 minutes  SIGNED:   Alessandra Bevels, MD  [facility] 06/12/2020, 3:12 PM

## 2020-06-12 NOTE — Care Management Important Message (Signed)
Important Message  Patient Details IM Letter given to the Patient Name: Margaret Cross MRN: [id number] Date of Birth: [birth year]   Medicare Important Message Given:  Yes     [name] 06/12/2020, 3:33 PM

## 2020-06-12 NOTE — Progress Notes (Signed)
Pt ate dinner and had no c/o nausea or pain.  Pt will discharge home with daughter.  Discharge teaching done and written instructions given.

## 2020-06-12 NOTE — Progress Notes (Addendum)
5 Days Post-Op    CC: Abdominal pain  Subjective: Tolerating full liquids, although she does not like the selections available.  She had a small BM this morning.  She is still feeling slightly distended.  Positive bowel sounds.  Objective: Vital signs in last 24 hours: Temp:  [97.7 F (36.5 C)-98.2 F (36.8 C)] 97.7 F (36.5 C) (08/12 0547) Pulse Rate:  [84-96] 84 (08/12 0547) Resp:  [16-18] 16 (08/12 0547) BP: (151-160)/(84-89) 153/84 (08/12 0547) SpO2:  [92 %-93 %] 92 % (08/12 0547) Last BM Date: 06/11/20 360 p.o. recorded Urine x2 recorded BM x1 recorded Afebrile vital signs are stable. No labs this a.m. No film this a.m.   Intake/Output from previous day: 08/11 0701 - 08/12 0700 In: 360 [P.O.:360] Out: -  Intake/Output this shift: No intake/output data recorded.  General appearance: alert, cooperative and no distress GI: Soft, sore, positive bowel sounds, port sites all look good.  Drain site has sealed.  She is tolerating full liquids, positive BM.  Lab Results:  Recent Labs    06/10/20 0623  WBC 6.9  HGB 12.9  HCT 39.0  PLT 526*    BMET Recent Labs    06/10/20 0623  NA 138  K 3.6  CL 101  CO2 25  GLUCOSE 146*  BUN 9  CREATININE 0.50  CALCIUM 8.2*   PT/INR No results for input(s): LABPROT, INR in the last 72 hours.  Recent Labs  Lab 06/05/20 0901 06/06/20 0524 06/07/20 0603 06/08/20 0604 06/09/20 0548  AST 21 135* 64* 28 22  ALT 75* 134* 107* 75* 56*  ALKPHOS 103 223* 193* 155* 132*  BILITOT 1.2 2.8* 1.7* 1.1 0.9  PROT 6.9 6.4* 5.8* 5.8* 5.9*  ALBUMIN 3.4* 2.4* 2.3* 2.3* 2.5*     Lipase     Component Value Date/Time   LIPASE 26 06/05/2020 0901     Medications: . amLODipine  10 mg Oral Daily  . citalopram  20 mg Oral Daily  . cycloSPORINE  1 drop Both Eyes BID  . guaiFENesin  1,200 mg Oral BID  . losartan  75 mg Oral Daily  . pantoprazole  40 mg Oral Daily  . polyethylene glycol  17 g Oral BID    Assessment/Plan Acute  respiratory failure with hypoxia/possible community-acquired pneumonia Hypertension Dehydration Hyperglycemia GERD Anxiety disorder Hyperlipidemia Hypokalemia Constipation - Per [facility] -  Acute emphysematous colitis S/p Laparoscopic cholecystectomy with intraoperative cholangiogram, 19 French drain placement, 06/07/2020, Dr. Darnell Level - POD #3 - Drain d/c'd 8/10 -  Post op Ileus improving. ROBF overnight/this AM. Given degree of colonic distension on xray yesterday will repeat this AM. Cont FLD for now.  - Mobilize for bowel function - Keep K > 4 and Mg > 2 for bowel function - Pulm toilet   FEN: FLD  ID: azithromycin 8/5 - 8/8; Flagyl 8/5 - 8/10. Rocephin 8/5 >> (on for PNA, per [facility]) DVT: SCDs; she can have DVT chemical prophylaxis from a gen surgery standpoint Follow-up: [facility]  Plan: From our standpoint she can stay on full liquids, and slowly advance to a soft diet as tolerated.  Continue to mobilize, no further surgical recommendations.  She can follow-up in our office, all of that information is in the AVS. Please call if we can be of further assistance.      LOS: 7 days    Donne Robillard 06/12/2020 Please see Amion

## 2020-06-12 NOTE — Progress Notes (Signed)
Occupational Therapy Treatment Patient Details Name: Margaret Cross MRN: [id number] DOB: 1940/11/25 Today's Date: 06/12/2020    History of present illness 79 y.o. female with a past medical history of anxiety disorder, essential hypertension. pt admitted with c/o R UQ pain,  fatigue, N/V, dx with CAP/hypoxia, transaminitis, Concern for gallbladder disease versus biliary disease. Pt s/p Lap chole on 8/7.   OT comments  Patient seated in recliner when therapist entered room. Patient reports independence with ADLs and ambulating to bathroom without nursing assistance. Patient ambulated to bathroom without device or oxygen, performed toileting and donned socks without assistance. Patient did not complain of shortness of breathe or need purse lipped breathing technique. Patient reports "I don't think I need rehab." Patient has met OT goals and independently performing self care tasks in hospital. OT will sign off.    Follow Up Recommendations  No OT follow up    Equipment Recommendations       Recommendations for Other Services      Precautions / Restrictions Precautions Precautions: Fall       Mobility Bed Mobility               General bed mobility comments: Patient seated in recliner when therapist entered room.  Transfers Overall transfer level: Independent               General transfer comment: Independent to perform standing, ambulating in room and toielt transfer without a device. Reports performing without nursing assistance.    Balance Overall balance assessment: No apparent balance deficits (not formally assessed)                                         ADL either performed or assessed with clinical judgement   ADL Overall ADL's : Modified independent             Lower Body Bathing: Modified independent         Lower Body Dressing Details (indicate cue type and reason): Increased time to donn socks but able to perform using  figure four position. Toilet Transfer: Programmer, applications Details (indicate cue type and reason): Patinet reports ambulating to bathroom independently. Demonstrates ability to perform for therapy. No loss of balance. Patient reports no fatigue or shortness of breath. Toileting- Clothing Manipulation and Hygiene: Independent Toileting - Clothing Manipulation Details (indicate cue type and reason): Independent to manage clothing, hospital gown and wiping. Reports independence with toileting     Functional mobility during ADLs: Independent General ADL Comments: Patient ambulating in room without assistance.     Vision   Vision Assessment?: No apparent visual deficits   Perception     Praxis      Cognition Arousal/Alertness: Awake/alert Behavior During Therapy: WFL for tasks assessed/performed Overall Cognitive Status: Within Functional Limits for tasks assessed                                          Exercises     Shoulder Instructions       General Comments      Pertinent Vitals/ Pain       Pain Assessment: No/denies pain  Home Living  Prior Functioning/Environment              Frequency           Progress Toward Goals  OT Goals(current goals can now be found in the care plan section)  Progress towards OT goals: Goals met/education completed, patient discharged from OT  Acute Rehab OT Goals Patient Stated Goal: Wants to go home soon. OT Goal Formulation: With patient Time For Goal Achievement: 06/20/20 Potential to Achieve Goals: Good  Plan All goals met and education completed, patient discharged from OT services    Co-evaluation                 AM-PAC OT "6 Clicks" Daily Activity     Outcome Measure   Help from another person eating meals?: None Help from another person taking care of personal grooming?: None Help from another person toileting, which  includes using toliet, bedpan, or urinal?: None Help from another person bathing (including washing, rinsing, drying)?: None Help from another person to put on and taking off regular upper body clothing?: None Help from another person to put on and taking off regular lower body clothing?: None 6 Click Score: 24    End of Session    OT Visit Diagnosis: Other abnormalities of gait and mobility (R26.89);Pain   Activity Tolerance Patient tolerated treatment well   Patient Left in chair;with call bell/phone within reach   Nurse Communication  (okay to see per RN. RN reports nursing will ambulate patinet.)        Time: 0981-1914 OT Time Calculation (min): 8 min  Charges: OT General Charges $OT Visit: 1 Visit OT Treatments $Self Care/Home Management : 8-22 mins  Derl Barrow, OTR/L [facility]  Office [phone] Pager: [place] 06/12/2020, 12:35 PM

## 2022-06-15 ENCOUNTER — Emergency Department (HOSPITAL_BASED_OUTPATIENT_CLINIC_OR_DEPARTMENT_OTHER): Payer: Medicare Other

## 2022-06-15 ENCOUNTER — Emergency Department (HOSPITAL_BASED_OUTPATIENT_CLINIC_OR_DEPARTMENT_OTHER)
Admission: EM | Admit: 2022-06-15 | Discharge: 2022-06-15 | Disposition: A | Discharge status: Home / Self Care | Payer: Medicare Other | Attending: Emergency Medicine | Admitting: Emergency Medicine

## 2022-06-15 ENCOUNTER — Encounter (HOSPITAL_BASED_OUTPATIENT_CLINIC_OR_DEPARTMENT_OTHER): Payer: Self-pay | Admitting: Urology

## 2022-06-15 DIAGNOSIS — S0181XA Laceration without foreign body of other part of head, initial encounter: Secondary | ICD-10-CM | POA: Diagnosis not present

## 2022-06-15 DIAGNOSIS — Z79899 Other long term (current) drug therapy: Secondary | ICD-10-CM | POA: Insufficient documentation

## 2022-06-15 DIAGNOSIS — S0081XA Abrasion of other part of head, initial encounter: Secondary | ICD-10-CM | POA: Insufficient documentation

## 2022-06-15 DIAGNOSIS — S01111A Laceration without foreign body of right eyelid and periocular area, initial encounter: Secondary | ICD-10-CM | POA: Insufficient documentation

## 2022-06-15 DIAGNOSIS — S80212A Abrasion, left knee, initial encounter: Secondary | ICD-10-CM | POA: Insufficient documentation

## 2022-06-15 DIAGNOSIS — S0990XA Unspecified injury of head, initial encounter: Secondary | ICD-10-CM | POA: Diagnosis present

## 2022-06-15 DIAGNOSIS — Z23 Encounter for immunization: Secondary | ICD-10-CM | POA: Insufficient documentation

## 2022-06-15 DIAGNOSIS — W19XXXA Unspecified fall, initial encounter: Secondary | ICD-10-CM | POA: Diagnosis not present

## 2022-06-15 MED ORDER — TETANUS-DIPHTH-ACELL PERTUSSIS 5-2.5-18.5 LF-MCG/0.5 IM SUSY
0.5000 mL | PREFILLED_SYRINGE | Freq: Once | INTRAMUSCULAR | Status: AC
  Administered 2022-06-15: 0.5 mL via INTRAMUSCULAR
  Filled 2022-06-15: qty 0.5

## 2022-06-15 MED ORDER — LIDOCAINE HCL 1 % IJ SOLN
INTRAMUSCULAR | Status: AC
  Filled 2022-06-15: qty 20

## 2022-06-15 NOTE — ED Triage Notes (Signed)
Mechanical fall in driveway approx 20 min PTA  Denies LOC,  Not on blood thinners  Laceration above right eye brow noted  Bleeding controlled  Abrasion to face noted as well  A&O x 4

## 2022-06-15 NOTE — ED Provider Notes (Signed)
[facility]GENCY DEPARTMENT Provider Note   CSN: [id number] Arrival date & time: 06/15/22  2005     History {Add pertinent medical, surgical, social history, OB history to HPI:1} Chief Complaint  Patient presents with   Fall   Laceration    Margaret Cross is a 81 y.o. female.  She had a mechanical fall in the driveway earlier today.  Struck her head, no loss of consciousness.  Complaining of feeling a little lightheaded and laceration above her right eye abrasions over her face and left knee.  She has been ambulatory since then without any difficulty.  No persistent nausea or vomiting.  She does not know when her last tetanus shot was  The history is provided by the patient and the spouse.  Fall This is a new problem. The current episode started 1 to 2 hours ago. The problem has not changed since onset.Associated symptoms include headaches. Pertinent negatives include no chest pain, no abdominal pain and no shortness of breath. Nothing aggravates the symptoms. Nothing relieves the symptoms. She has tried nothing for the symptoms. The treatment provided no relief.  Laceration Location:  Face Facial laceration location:  R eyebrow Length:  6 Quality: jagged   Bleeding: controlled   Laceration mechanism:  Fall Tetanus status:  Out of date Associated symptoms: no fever        Home Medications Prior to Admission medications   Medication Sig Start Date End Date Taking? Authorizing Provider  acetaminophen (TYLENOL) 325 MG tablet You can take 2 tablets every 4 hours as needed for pain.  You can alternate this with ibuprofen.  Do not take more than 4000 mg of Tylenol per day.  You can buy this over-the-counter at any drugstore. 06/09/20   Earnstine Regal, PA-C  amitriptyline (ELAVIL) 25 MG tablet Take 25 mg by mouth at bedtime as needed for sleep.  05/26/20   [provider]  amLODipine (NORVASC) 10 MG tablet Take 10 mg by mouth daily. 03/13/20   [provider]  calcium carbonate (TUMS - DOSED IN MG ELEMENTAL CALCIUM) 500 MG chewable tablet Chew 1 tablet by mouth 2 (two) times daily as needed for indigestion or heartburn.    [provider]  citalopram (CELEXA) 40 MG tablet Take 40 mg by mouth daily.  02/15/18   [provider]  DEXILANT 60 MG capsule Take 60 mg by mouth daily.  02/15/18   [provider]  famotidine (PEPCID) 40 MG tablet Take 40 mg by mouth daily as needed for heartburn or indigestion.    [provider]  ibuprofen (ADVIL) 200 MG tablet You can take 2 tablets every 6 hours as needed for pain.  You can alternate with plain Tylenol/acetaminophen.  You can buy this over-the-counter at any drugstore. 06/09/20   Earnstine Regal, PA-C  losartan (COZAAR) 50 MG tablet Take 1.5 tablets (75 mg total) by mouth daily. 06/12/20 07/12/20  Guilford Shi, MD  ondansetron (ZOFRAN ODT) 4 MG disintegrating tablet 4mg  ODT q4 hours prn nausea/vomit 06/02/20   Jacqlyn Larsen, PA-C  oxyCODONE (OXY IR/ROXICODONE) 5 MG immediate release tablet You can take 1 tablet every 6 hours as needed for pain not relieved by Tylenol and ibuprofen. 06/09/20   Earnstine Regal, PA-C  polyethylene glycol (MIRALAX / GLYCOLAX) 17 g packet Take 17 g by mouth daily. Hold for diarrhea 06/12/20   Guilford Shi, MD  RESTASIS 0.05 % ophthalmic emulsion Place 1 drop into both eyes 2 (two) times daily.  02/14/18  [provider]  rosuvastatin (CRESTOR) 5 MG tablet Take 5 mg by mouth as directed. Take on MWF 12/14/17   [provider]      Allergies    Bupropion and Sulfa antibiotics    Review of Systems   Review of Systems  Constitutional:  Negative for fever.  Respiratory:  Negative for shortness of breath.   Cardiovascular:  Negative for chest pain.  Gastrointestinal:  Negative for abdominal pain.  Skin:  Positive for wound.  Neurological:  Positive for headaches.    Physical Exam Updated Vital Signs BP (!)  147/95 (BP Location: Left Arm)   Pulse 62   Temp (!) 97.5 F (36.4 C) (Oral)   Resp 20   Ht 5\' 7"  (1.702 m)   Wt 90.2 kg   SpO2 96%   BMI 31.15 kg/m  Physical Exam Vitals and nursing note reviewed.  Constitutional:      General: She is not in acute distress.    Appearance: Normal appearance. She is well-developed.  HENT:     Head: Normocephalic.     Comments: She has a jagged 6 cm laceration right forehead above her eyebrow.  She has some abrasions over her nose and her cheek.  There is no facial instability or swelling.  No malocclusion. Eyes:     Conjunctiva/sclera: Conjunctivae normal.  Cardiovascular:     Rate and Rhythm: Normal rate and regular rhythm.     Heart sounds: No murmur heard. Pulmonary:     Effort: Pulmonary effort is normal. No respiratory distress.     Breath sounds: Normal breath sounds.  Abdominal:     Palpations: Abdomen is soft.     Tenderness: There is no abdominal tenderness.  Musculoskeletal:        General: Tenderness present. Normal range of motion.     Cervical back: Neck supple.     Comments: She has some mild tenderness over her left patella with an abrasion.  Full range of motion.  Distal neurovascular intact.  Skin:    General: Skin is warm and dry.     Capillary Refill: Capillary refill takes less than 2 seconds.  Neurological:     General: No focal deficit present.     Mental Status: She is alert.     ED Results / Procedures / Treatments   Labs (all labs ordered are listed, but only abnormal results are displayed) Labs Reviewed - No data to display  EKG None  Radiology CT Head Wo Contrast  Result Date: 06/15/2022 CLINICAL DATA:  Blunt trauma.  Facial trauma EXAM: CT HEAD WITHOUT CONTRAST CT MAXILLOFACIAL WITHOUT CONTRAST CT CERVICAL SPINE WITHOUT CONTRAST TECHNIQUE: Multidetector CT imaging of the head, cervical spine, and maxillofacial structures were performed using the standard protocol without intravenous contrast. Multiplanar  CT image reconstructions of the cervical spine and maxillofacial structures were also generated. RADIATION DOSE REDUCTION: This exam was performed according to the departmental dose-optimization program which includes automated exposure control, adjustment of the mA and/or kV according to patient size and/or use of iterative reconstruction technique. COMPARISON:  None Available. FINDINGS: CT HEAD FINDINGS Brain: No acute intracranial hemorrhage. No focal mass lesion. No CT evidence of acute infarction. No midline shift or mass effect. No hydrocephalus. Basilar cisterns are patent. There are periventricular and subcortical white matter hypodensities. Generalized cortical atrophy. Vascular: No hyperdense vessel or unexpected calcification. Skull: Normal. Negative for fracture or focal lesion. Sinuses/Orbits: Paranasal sinuses and mastoid air cells are clear. Orbits are clear. Other: Small  laceration over the RIGHT orbit.  No fracture CT MAXILLOFACIAL FINDINGS Osseous: No fracture or mandibular dislocation. No destructive process. Orbits: Negative. No traumatic or inflammatory finding. Sinuses: Clear. Soft tissues: Small laceration over the RIGHT orbit. No orbital injury fracture CT CERVICAL SPINE FINDINGS Alignment: Normal alignment of the cervical vertebral bodies. Skull base and vertebrae: Normal craniocervical junction. No loss of vertebral body height or disc height. Normal facet articulation. No evidence of fracture. Soft tissues and spinal canal: No prevertebral soft tissue swelling. No perispinal or epidural hematoma. Disc levels:  Unremarkable Upper chest: Clear Other: None IMPRESSION: 1. No intracranial trauma. 2. Atrophy and white matter microvascular disease. 3. Shallow laceration over the RIGHT orbit. No orbital injury or fracture. 4. No facial bone fracture. 5. No cervical spine fracture. Electronically Signed   By: Suzy Bouchard M.D.   On: 06/15/2022 20:53   CT Maxillofacial Wo Contrast  Result  Date: 06/15/2022 CLINICAL DATA:  Blunt trauma.  Facial trauma EXAM: CT HEAD WITHOUT CONTRAST CT MAXILLOFACIAL WITHOUT CONTRAST CT CERVICAL SPINE WITHOUT CONTRAST TECHNIQUE: Multidetector CT imaging of the head, cervical spine, and maxillofacial structures were performed using the standard protocol without intravenous contrast. Multiplanar CT image reconstructions of the cervical spine and maxillofacial structures were also generated. RADIATION DOSE REDUCTION: This exam was performed according to the departmental dose-optimization program which includes automated exposure control, adjustment of the mA and/or kV according to patient size and/or use of iterative reconstruction technique. COMPARISON:  None Available. FINDINGS: CT HEAD FINDINGS Brain: No acute intracranial hemorrhage. No focal mass lesion. No CT evidence of acute infarction. No midline shift or mass effect. No hydrocephalus. Basilar cisterns are patent. There are periventricular and subcortical white matter hypodensities. Generalized cortical atrophy. Vascular: No hyperdense vessel or unexpected calcification. Skull: Normal. Negative for fracture or focal lesion. Sinuses/Orbits: Paranasal sinuses and mastoid air cells are clear. Orbits are clear. Other: Small laceration over the RIGHT orbit.  No fracture CT MAXILLOFACIAL FINDINGS Osseous: No fracture or mandibular dislocation. No destructive process. Orbits: Negative. No traumatic or inflammatory finding. Sinuses: Clear. Soft tissues: Small laceration over the RIGHT orbit. No orbital injury fracture CT CERVICAL SPINE FINDINGS Alignment: Normal alignment of the cervical vertebral bodies. Skull base and vertebrae: Normal craniocervical junction. No loss of vertebral body height or disc height. Normal facet articulation. No evidence of fracture. Soft tissues and spinal canal: No prevertebral soft tissue swelling. No perispinal or epidural hematoma. Disc levels:  Unremarkable Upper chest: Clear Other: None  IMPRESSION: 1. No intracranial trauma. 2. Atrophy and white matter microvascular disease. 3. Shallow laceration over the RIGHT orbit. No orbital injury or fracture. 4. No facial bone fracture. 5. No cervical spine fracture. Electronically Signed   By: Suzy Bouchard M.D.   On: 06/15/2022 20:53   CT Cervical Spine Wo Contrast  Result Date: 06/15/2022 CLINICAL DATA:  Blunt trauma.  Facial trauma EXAM: CT HEAD WITHOUT CONTRAST CT MAXILLOFACIAL WITHOUT CONTRAST CT CERVICAL SPINE WITHOUT CONTRAST TECHNIQUE: Multidetector CT imaging of the head, cervical spine, and maxillofacial structures were performed using the standard protocol without intravenous contrast. Multiplanar CT image reconstructions of the cervical spine and maxillofacial structures were also generated. RADIATION DOSE REDUCTION: This exam was performed according to the departmental dose-optimization program which includes automated exposure control, adjustment of the mA and/or kV according to patient size and/or use of iterative reconstruction technique. COMPARISON:  None Available. FINDINGS: CT HEAD FINDINGS Brain: No acute intracranial hemorrhage. No focal mass lesion. No CT evidence of acute infarction. No  midline shift or mass effect. No hydrocephalus. Basilar cisterns are patent. There are periventricular and subcortical white matter hypodensities. Generalized cortical atrophy. Vascular: No hyperdense vessel or unexpected calcification. Skull: Normal. Negative for fracture or focal lesion. Sinuses/Orbits: Paranasal sinuses and mastoid air cells are clear. Orbits are clear. Other: Small laceration over the RIGHT orbit.  No fracture CT MAXILLOFACIAL FINDINGS Osseous: No fracture or mandibular dislocation. No destructive process. Orbits: Negative. No traumatic or inflammatory finding. Sinuses: Clear. Soft tissues: Small laceration over the RIGHT orbit. No orbital injury fracture CT CERVICAL SPINE FINDINGS Alignment: Normal alignment of the cervical  vertebral bodies. Skull base and vertebrae: Normal craniocervical junction. No loss of vertebral body height or disc height. Normal facet articulation. No evidence of fracture. Soft tissues and spinal canal: No prevertebral soft tissue swelling. No perispinal or epidural hematoma. Disc levels:  Unremarkable Upper chest: Clear Other: None IMPRESSION: 1. No intracranial trauma. 2. Atrophy and white matter microvascular disease. 3. Shallow laceration over the RIGHT orbit. No orbital injury or fracture. 4. No facial bone fracture. 5. No cervical spine fracture. Electronically Signed   By: Genevive Bi M.D.   On: 06/15/2022 20:53    Procedures .Marland KitchenLaceration Repair  Date/Time: 06/15/2022 9:28 PM  Performed by: Terrilee Files, MD Authorized by: Terrilee Files, MD   Consent:    Consent obtained:  Verbal   Consent given by:  Patient   Risks discussed:  Infection, pain, poor cosmetic result, poor wound healing and retained foreign body   Alternatives discussed:  No treatment and delayed treatment Universal protocol:    Procedure explained and questions answered to patient or proxy's satisfaction: yes     Patient identity confirmed:  Verbally with patient Laceration details:    Location:  Face   Face location:  R eyebrow   Length (cm):  6 Pre-procedure details:    Preparation:  Patient was prepped and draped in usual sterile fashion Treatment:    Area cleansed with:  Saline   Amount of cleaning:  Standard   Irrigation solution:  Sterile saline   Debridement:  None   Layers/structures repaired:  Deep subcutaneous Deep subcutaneous:    Suture size:  4-0   Suture material:  Vicryl   Suture technique:  Simple interrupted Skin repair:    Repair method:  Sutures   Suture size:  6-0   Suture material:  Nylon   Suture technique:  Simple interrupted   Number of sutures:  7 Approximation:    Approximation:  Close Repair type:    Repair type:  Intermediate Post-procedure details:     Procedure completion:  Tolerated well, no immediate complications   {Document cardiac monitor, telemetry assessment procedure when appropriate:1}  Medications Ordered in ED Medications  Tdap (BOOSTRIX) injection 0.5 mL (has no administration in time range)    ED Course/ Medical Decision Making/ A&P                           Medical Decision Making Risk Prescription drug management.   ***  {Document critical care time when appropriate:1} {Document review of labs and clinical decision tools ie heart score, Chads2Vasc2 etc:1}  {Document your independent review of radiology images, and any outside records:1} {Document your discussion with family members, caretakers, and with consultants:1} {Document social determinants of health affecting pt's care:1} {Document your decision making why or why not admission, treatments were needed:1} Final Clinical Impression(s) / ED Diagnoses Final diagnoses:  None  Rx / DC Orders ED Discharge Orders     None

## 2022-06-15 NOTE — ED Notes (Signed)
Patient transported to CT 

## 2022-06-15 NOTE — Discharge Instructions (Signed)
You were seen in the emergency department for evaluation of injuries from a fall.  You had a laceration of your forehead that was sutured and these will need to be removed in 5 to 7 days.  You can get the area wet and use soap and water.  You had a CAT scan of your head face and neck that did not show any significant injuries.  You can use Tylenol and ibuprofen as needed for pain.  Return to the emergency department if any worsening or concerning symptoms

## 2022-06-22 ENCOUNTER — Encounter (HOSPITAL_BASED_OUTPATIENT_CLINIC_OR_DEPARTMENT_OTHER): Payer: Self-pay

## 2022-06-22 ENCOUNTER — Other Ambulatory Visit: Payer: Self-pay

## 2022-06-22 ENCOUNTER — Emergency Department (HOSPITAL_BASED_OUTPATIENT_CLINIC_OR_DEPARTMENT_OTHER)
Admission: EM | Admit: 2022-06-22 | Discharge: 2022-06-22 | Disposition: A | Discharge status: Home / Self Care | Payer: Medicare Other | Attending: Emergency Medicine | Admitting: Emergency Medicine

## 2022-06-22 DIAGNOSIS — W01198D Fall on same level from slipping, tripping and stumbling with subsequent striking against other object, subsequent encounter: Secondary | ICD-10-CM | POA: Insufficient documentation

## 2022-06-22 DIAGNOSIS — S0591XD Unspecified injury of right eye and orbit, subsequent encounter: Secondary | ICD-10-CM | POA: Diagnosis present

## 2022-06-22 DIAGNOSIS — Z4802 Encounter for removal of sutures: Secondary | ICD-10-CM | POA: Diagnosis not present

## 2022-06-22 DIAGNOSIS — I10 Essential (primary) hypertension: Secondary | ICD-10-CM | POA: Diagnosis not present

## 2022-06-22 DIAGNOSIS — S01101D Unspecified open wound of right eyelid and periocular area, subsequent encounter: Secondary | ICD-10-CM | POA: Insufficient documentation

## 2022-06-22 DIAGNOSIS — Z79899 Other long term (current) drug therapy: Secondary | ICD-10-CM | POA: Diagnosis not present

## 2022-06-22 NOTE — Discharge Instructions (Addendum)
Follow-up with your primary care doctor as needed for continued evaluation and management of your wound.  Return if development of any new or worsening symptoms.

## 2022-06-22 NOTE — ED Triage Notes (Signed)
Pt is here for removal sutures right upper brow. Per patients no problems

## 2022-06-22 NOTE — ED Provider Notes (Signed)
[facility] EMERGENCY DEPARTMENT Provider Note   CSN: [id number] Arrival date & time: 06/22/22  3664     History  Chief Complaint  Patient presents with   Suture / Staple Removal    Margaret Cross is a 81 y.o. female.  Patient with history of hypertension presents today requesting suture removal.  She states that 1 week ago she tripped and fell and had a laceration over her right upper eyebrow that required sutures to repair.  States she was told to return in 7 days to have them removed and presents today for same.  She denies any fevers, chills, redness around the wound, pain, or drainage.  The history is provided by the patient. No language interpreter was used.  Suture / Staple Removal       Home Medications Prior to Admission medications   Medication Sig Start Date End Date Taking? Authorizing Provider  acetaminophen (TYLENOL) 325 MG tablet You can take 2 tablets every 4 hours as needed for pain.  You can alternate this with ibuprofen.  Do not take more than 4000 mg of Tylenol per day.  You can buy this over-the-counter at any drugstore. 06/09/20   Sherrie George, PA-C  amitriptyline (ELAVIL) 25 MG tablet Take 25 mg by mouth at bedtime as needed for sleep.  05/26/20   [provider]  amLODipine (NORVASC) 10 MG tablet Take 10 mg by mouth daily. 03/13/20   [provider]  calcium carbonate (TUMS - DOSED IN MG ELEMENTAL CALCIUM) 500 MG chewable tablet Chew 1 tablet by mouth 2 (two) times daily as needed for indigestion or heartburn.    [provider]  citalopram (CELEXA) 40 MG tablet Take 40 mg by mouth daily.  02/15/18   [provider]  DEXILANT 60 MG capsule Take 60 mg by mouth daily.  02/15/18   [provider]  famotidine (PEPCID) 40 MG tablet Take 40 mg by mouth daily as needed for heartburn or indigestion.    [provider]  ibuprofen (ADVIL) 200 MG tablet You can take 2 tablets every 6 hours as needed for  pain.  You can alternate with plain Tylenol/acetaminophen.  You can buy this over-the-counter at any drugstore. 06/09/20   Sherrie George, PA-C  losartan (COZAAR) 50 MG tablet Take 1.5 tablets (75 mg total) by mouth daily. 06/12/20 07/12/20  Alessandra Bevels, MD  ondansetron (ZOFRAN ODT) 4 MG disintegrating tablet 4mg  ODT q4 hours prn nausea/vomit 06/02/20   08/02/20, PA-C  oxyCODONE (OXY IR/ROXICODONE) 5 MG immediate release tablet You can take 1 tablet every 6 hours as needed for pain not relieved by Tylenol and ibuprofen. 06/09/20   08/09/20, PA-C  polyethylene glycol (MIRALAX / GLYCOLAX) 17 g packet Take 17 g by mouth daily. Hold for diarrhea 06/12/20   08/12/20, MD  RESTASIS 0.05 % ophthalmic emulsion Place 1 drop into both eyes 2 (two) times daily.  02/14/18   [provider]  rosuvastatin (CRESTOR) 5 MG tablet Take 5 mg by mouth as directed. Take on MWF 12/14/17   [provider]      Allergies    Bupropion and Sulfa antibiotics    Review of Systems   Review of Systems  Skin:  Positive for wound.  All other systems reviewed and are negative.   Physical Exam Updated Vital Signs BP (!) 188/69   Pulse 94   Temp 98.1 F (36.7 C) (Oral)   Resp 18   SpO2 96%  Physical  Exam Vitals and nursing note reviewed.  Constitutional:      General: She is not in acute distress.    Appearance: Normal appearance. She is normal weight. She is not ill-appearing, toxic-appearing or diaphoretic.  HENT:     Head: Normocephalic and atraumatic.     Comments: Well-healing wound above the right eyebrow with 7 nonabsorbable sutures in place.  No erythema, fluctuance, induration, or drainage present. Cardiovascular:     Rate and Rhythm: Normal rate.  Pulmonary:     Effort: Pulmonary effort is normal. No respiratory distress.  Musculoskeletal:        General: Normal range of motion.     Cervical back: Normal range of motion.  Skin:    General: Skin is warm and dry.   Neurological:     General: No focal deficit present.     Mental Status: She is alert.  Psychiatric:        Mood and Affect: Mood normal.        Behavior: Behavior normal.     ED Results / Procedures / Treatments   Labs (all labs ordered are listed, but only abnormal results are displayed) Labs Reviewed - No data to display  EKG None  Radiology No results found.  Procedures .Suture Removal  Date/Time: 06/22/2022 10:27 AM  Performed by: Silva Bandy, PA-C Authorized by: Silva Bandy, PA-C   Consent:    Consent obtained:  Verbal   Consent given by:  Patient   Risks, benefits, and alternatives were discussed: yes     Risks discussed:  Bleeding, pain and wound separation   Alternatives discussed:  No treatment, delayed treatment, alternative treatment, observation and referral Universal protocol:    Procedure explained and questions answered to patient or proxy's satisfaction: yes     Patient identity confirmed:  Verbally with patient Location:    Location:  Head/neck   Head/neck location:  Forehead Procedure details:    Wound appearance:  No signs of infection, good wound healing and clean   Number of sutures removed:  7 Post-procedure details:    Post-removal:  No dressing applied   Procedure completion:  Tolerated well, no immediate complications     Medications Ordered in ED Medications - No data to display  ED Course/ Medical Decision Making/ A&P                           Medical Decision Making  Staple removal   Pt to ER for staple/suture removal and wound check as above.  She is afebrile, nontoxic-appearing, and in no acute distress with reassuring vital signs.  Procedure tolerated well, wound without erythema, fluctuance, induration, or drainage. No signs of infection. Scar minimization & return precautions given at dc.  Patient is understanding and amenable with plan, discharged in stable condition.   Final Clinical Impression(s) / ED  Diagnoses Final diagnoses:  Encounter for removal of sutures    Rx / DC Orders ED Discharge Orders     None     An After Visit Summary was printed and given to the patient.     Vear Clock 06/22/22 1029    Vanetta Mulders, MD 06/25/22 [phone]

## 2022-07-26 IMAGING — MR MR ABDOMEN WO/W CM MRCP
13 of 19 series · 30 of 48 positions shown · IV contrast ([ID] [id number])
Comparison: CT 06/02/2020, CT angio chest 06/05/2020
COMPARISON: CT 06/02/2020, CT angio chest 06/05/2020

Addendum:
CLINICAL DATA: Right upper quadrant pain, nondiagnostic ultrasound,
elevated LFTs

EXAM:
MRI ABDOMEN WITHOUT AND WITH CONTRAST (INCLUDING MRCP)
TECHNIQUE: Multiplanar multisequence MR imaging of the abdomen was performed
both before and after the administration of intravenous contrast.
Heavily T2-weighted images of the biliary and pancreatic ducts were
obtained, and three-dimensional MRCP images were rendered by post
processing.
CONTRAST:  7.5mL GADAVIST GADOBUTROL 1 MMOL/ML IV SOLN

[Series 3: T2 fat-sat · axial · 6.0mm · 1.25mm/px · z∈[-104,+192]mm · 2 of 42 slices shown]
[im 1/42]
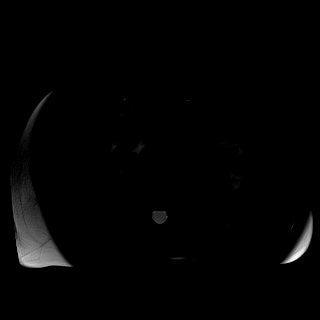
[im 42/42]
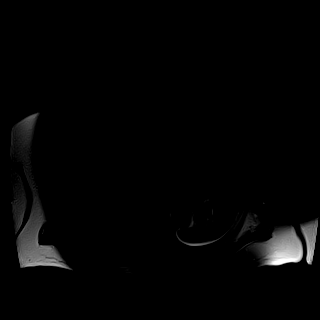

[Series 7: DWI · axial · 6.0mm · 1.49mm/px · z∈[-104,+192]mm · 3 of 84 slices shown (1 of 2)]
[im 1/84]
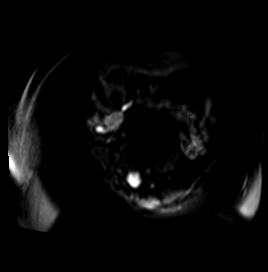
[im 42/84]
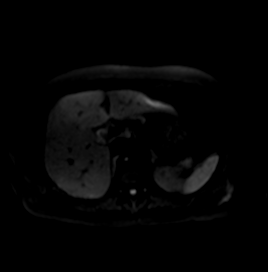
[im 84/84]
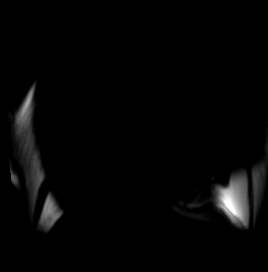

[Series 8: DWI · axial · 6.0mm · 1.49mm/px · 1 of 42 slices shown (2 of 2)]
[im 1/42]
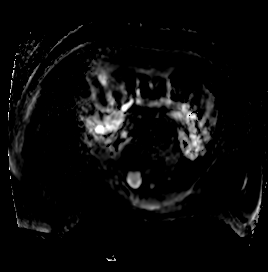

[Series 9: T2 · coronal · 6.0mm · 1.64mm/px · 1 of 30 slices shown (1 of 2)]
[im 1/30]
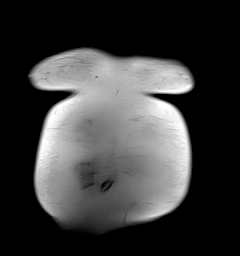

[Series 10: T1 · axial · 3.0mm · 1.25mm/px · z∈[-105,+180]mm · 3 of 96 slices shown (1 of 2)]
[im 1/96]
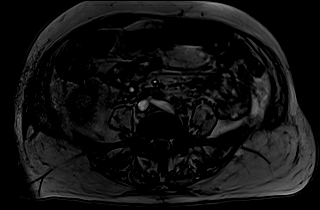
[im 48/96]
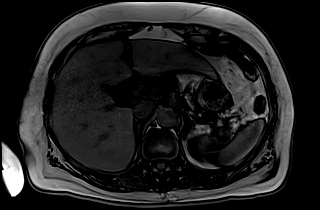
[im 96/96]
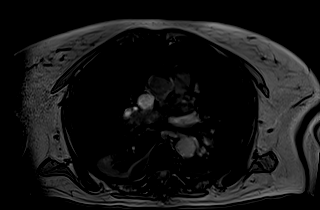

[Series 11: T1 · axial · 3.0mm · 1.25mm/px · z∈[-105,+180]mm · 3 of 96 slices shown (2 of 2)]
[im 1/96]
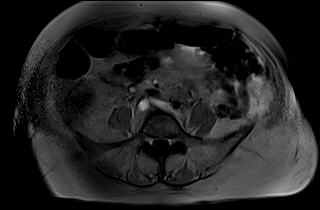
[im 48/96]
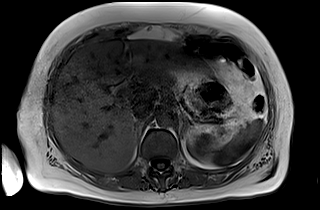
[im 96/96]
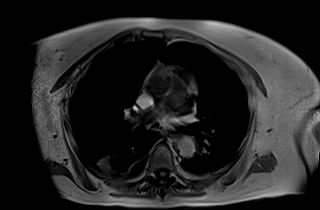

[Series 14: cor_3d_spc_trig · coronal · 1.0mm · 0.49mm/px · 2 of 72 slices shown]
[im 1/72]
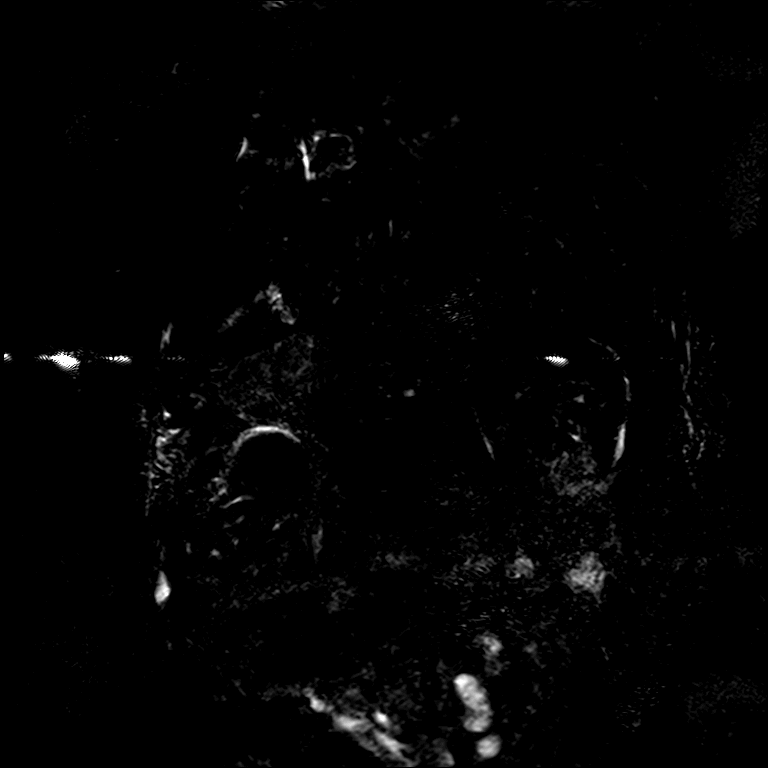
[im 72/72]
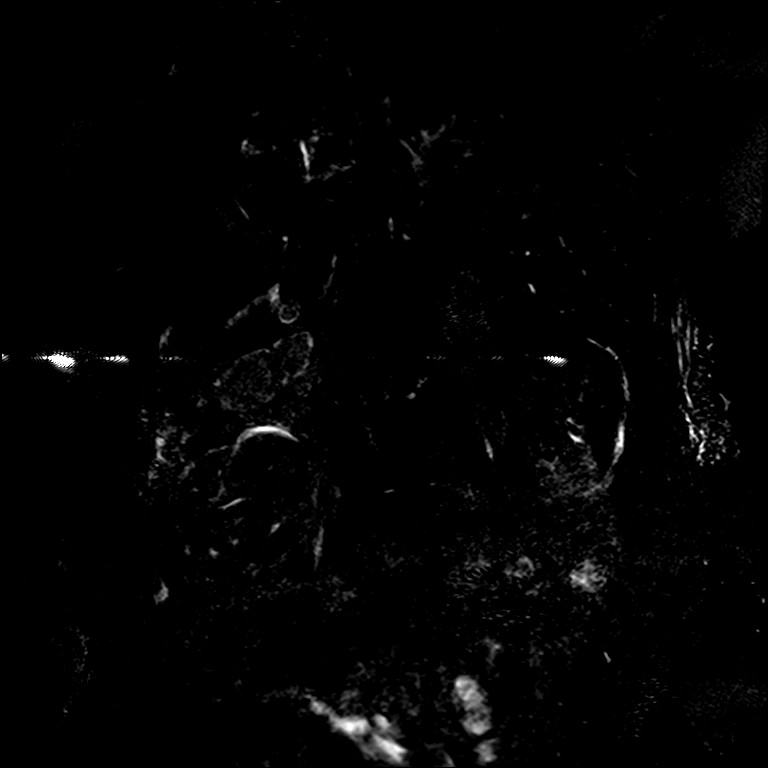

[Series 16: T2 · axial · 6.0mm · 1.56mm/px · 1 of 35 slices shown (2 of 2)]
[im 1/35]
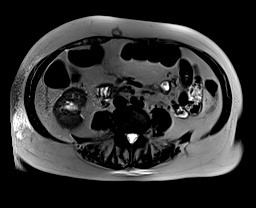

[Series 18: T1 dynamic · axial · 3.0mm · 1.25mm/px · z∈[-93,+168]mm · 3 of 88 slices shown (1 of 5)]
[im 1/88]
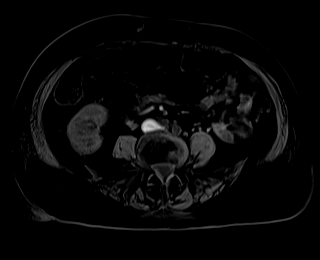
[im 44/88]
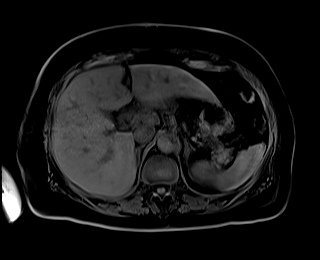
[im 88/88]
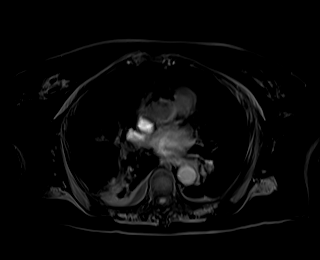

[Series 22: T1 dynamic · axial · 3.0mm · 1.25mm/px · z∈[-93,+168]mm · 3 of 88 slices shown (2 of 5)]
[im 1/88]
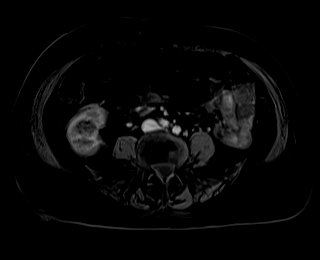
[im 44/88]
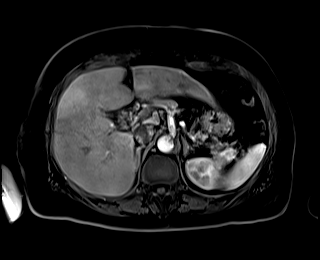
[im 88/88]
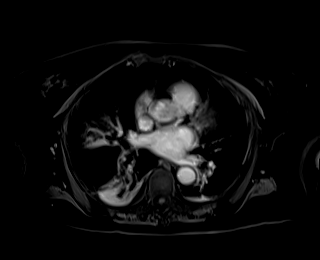

[Series 23: T1 dynamic · axial · 3.0mm · 1.25mm/px · z∈[-93,+168]mm · 3 of 88 slices shown (3 of 5)]
[im 1/88]
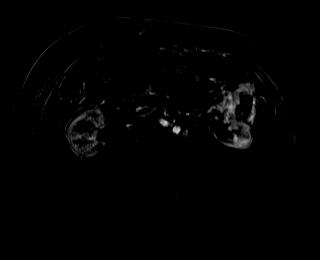
[im 44/88]
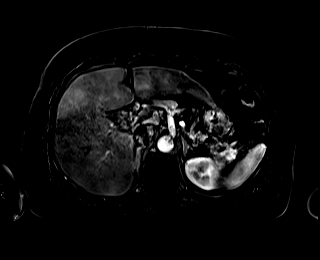
[im 88/88]
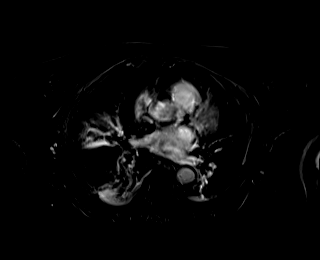

[Series 26: T1 dynamic · axial · 3.0mm · 1.25mm/px · z∈[-93,+168]mm · 3 of 88 slices shown (4 of 5)]
[im 1/88]
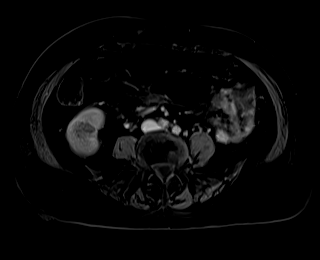
[im 44/88]
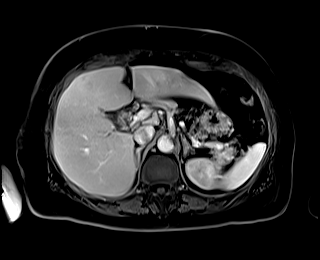
[im 88/88]
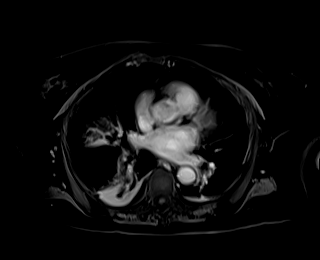

[Series 27: T1 dynamic · axial · 3.0mm · 1.25mm/px · z∈[-93,+36]mm · 2 of 88 slices shown (5 of 5)]
[im 1/88]
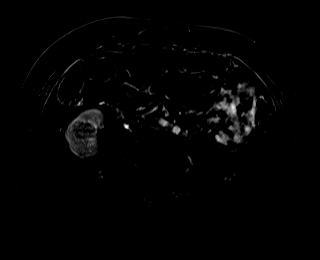
[im 44/88]
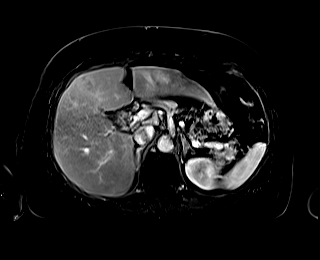

[30 of 48 positions shown; findings below may reference images not displayed]

FINDINGS: Lower chest: There are small bilateral effusions, right slightly
greater than left with adjacent areas of atelectatic lung which
appear fairly uniform in signal intensity though underlying
infection is not fully excluded. Cardiac size upper limits normal

Hepatobiliary: No focal liver lesions are evident. The liver surface
is smooth. Normal intrinsic liver signal without significant dropout
on out of phase imaging to suggest fatty infiltration. There is
however some mild hyperemic enhancement adjacent the gallbladder
fossa which is likely reactive to the gallbladder inflammation.
Additional mildly increased T2 signal about the biliary tree likely
reflecting some periportal edema or inflammation.

The gallbladder itself is markedly distended with a mixture of
tumefactive sludge and more normal bile which is best seen on the
source images and does extend into the normally tapering distal bile
duct. There is diffuse gallbladder wall thickening and significant
enhancement as well as bubbly foci T2 signal dropout towards the
gallbladder fundus and within the gallbladder fossa concerning for
emphysematous cholecystitis. Extensive surrounding T2 hyperintense
inflammatory changes are present.

Pancreas: Mild pancreatic atrophy. No visible enhancing or
hypoenhancing lesions within the pancreas. No pancreatic ductal
dilatation.

Spleen: Normal in size. No concerning splenic lesions. Foci of
signal dropout towards the splenic hilum compatible with
calcifications seen on comparison is. Including a larger focus
likely reflecting the suspected splenic artery aneurysm.

Adrenals/Urinary Tract: Mild thickening of the left adrenal gland
without discernible worrisome nodular mass compatible with adrenal
hyperplasia. Mild bilateral perinephric stranding, nonspecific.
Bilateral extrarenal pelves as well as several parapelvic renal
cysts. No concerning renal lesions.

Stomach/Bowel: Some likely reactive edematous changes about the
hepatic flexure which closely abuts the distended tip of the
gallbladder.

Vascular/Lymphatic: Calcifications towards the splenic hilum
compatible with splenic artery aneurysm seen on comparison CT. No
other major vascular abnormalities. No worrisome adenopathy.

Other: Inflammatory changes about the gallbladder and right upper
quadrant. Mild body wall edema. Emphysematous changes appear
confined to the bladder wall without evidence of free air.

Musculoskeletal: No concerning marrow signal abnormalities or
abnormal cord signal. Degenerative changes are present throughout
the spine without convincing acute osseous injury. These include
some Modic type endplate changes multiple levels of spine and
anterior thoracic disc spaces.
IMPRESSION: 1. Gallbladder demonstrating features of acute emphysematous
cholecystitis and containing a large volume of tumefactive sludge
extending into the proximal cystic duct but without distal common
bile duct stone. Extensive surrounding inflammatory changes and
hyperemic hyperenhancement of the adjacent liver parenchyma along
the gallbladder fossa. Some mild adjacent inflammatory changes upon
the hepatic flexure as well.
2. There is diffusely increased T2 signal about the biliary tree
which could reflect some periportal edema or
inflammation/cholangitis. Correlate with clinical findings and
laboratory values.
3. Small bilateral effusions, right slightly greater than left with
adjacent areas of atelectatic lung. Underlying infection is not
fully excluded.

Currently attempting to contact the ordering provider with a
critical value result. Addendum will be submitted upon case
discussion.

ADDENDUM:
These results were called by telephone on 06/06/2020 at [DATE] to
provider Dr Victoria, who verbally acknowledged these results.

*** End of Addendum ***
FINDINGS: Lower chest: There are small bilateral effusions, right slightly
greater than left with adjacent areas of atelectatic lung which
appear fairly uniform in signal intensity though underlying
infection is not fully excluded. Cardiac size upper limits normal

Hepatobiliary: No focal liver lesions are evident. The liver surface
is smooth. Normal intrinsic liver signal without significant dropout
on out of phase imaging to suggest fatty infiltration. There is
however some mild hyperemic enhancement adjacent the gallbladder
fossa which is likely reactive to the gallbladder inflammation.
Additional mildly increased T2 signal about the biliary tree likely
reflecting some periportal edema or inflammation.

The gallbladder itself is markedly distended with a mixture of
tumefactive sludge and more normal bile which is best seen on the
source images and does extend into the normally tapering distal bile
duct. There is diffuse gallbladder wall thickening and significant
enhancement as well as bubbly foci T2 signal dropout towards the
gallbladder fundus and within the gallbladder fossa concerning for
emphysematous cholecystitis. Extensive surrounding T2 hyperintense
inflammatory changes are present.

Pancreas: Mild pancreatic atrophy. No visible enhancing or
hypoenhancing lesions within the pancreas. No pancreatic ductal
dilatation.

Spleen: Normal in size. No concerning splenic lesions. Foci of
signal dropout towards the splenic hilum compatible with
calcifications seen on comparison is. Including a larger focus
likely reflecting the suspected splenic artery aneurysm.

Adrenals/Urinary Tract: Mild thickening of the left adrenal gland
without discernible worrisome nodular mass compatible with adrenal
hyperplasia. Mild bilateral perinephric stranding, nonspecific.
Bilateral extrarenal pelves as well as several parapelvic renal
cysts. No concerning renal lesions.

Stomach/Bowel: Some likely reactive edematous changes about the
hepatic flexure which closely abuts the distended tip of the
gallbladder.

Vascular/Lymphatic: Calcifications towards the splenic hilum
compatible with splenic artery aneurysm seen on comparison CT. No
other major vascular abnormalities. No worrisome adenopathy.

Other: Inflammatory changes about the gallbladder and right upper
quadrant. Mild body wall edema. Emphysematous changes appear
confined to the bladder wall without evidence of free air.

Musculoskeletal: No concerning marrow signal abnormalities or
abnormal cord signal. Degenerative changes are present throughout
the spine without convincing acute osseous injury. These include
some Modic type endplate changes multiple levels of spine and
anterior thoracic disc spaces.
IMPRESSION: 1. Gallbladder demonstrating features of acute emphysematous
cholecystitis and containing a large volume of tumefactive sludge
extending into the proximal cystic duct but without distal common
bile duct stone. Extensive surrounding inflammatory changes and
hyperemic hyperenhancement of the adjacent liver parenchyma along
the gallbladder fossa. Some mild adjacent inflammatory changes upon
the hepatic flexure as well.
2. There is diffusely increased T2 signal about the biliary tree
which could reflect some periportal edema or
inflammation/cholangitis. Correlate with clinical findings and
laboratory values.
3. Small bilateral effusions, right slightly greater than left with
adjacent areas of atelectatic lung. Underlying infection is not
fully excluded.

Currently attempting to contact the ordering provider with a
critical value result. Addendum will be submitted upon case
discussion.
